# Patient Record
Sex: Female | Born: 1935
Health system: Southern US, Community
[De-identification: ages and names within clinical notes are randomized; demographics above are authoritative.]

## PROBLEM LIST (undated history)

## (undated) DIAGNOSIS — I2699 Other pulmonary embolism without acute cor pulmonale: Secondary | ICD-10-CM

## (undated) DIAGNOSIS — E785 Hyperlipidemia, unspecified: Secondary | ICD-10-CM

## (undated) DIAGNOSIS — N183 Chronic kidney disease, stage 3 unspecified: Secondary | ICD-10-CM

## (undated) DIAGNOSIS — M1990 Unspecified osteoarthritis, unspecified site: Secondary | ICD-10-CM

## (undated) DIAGNOSIS — R011 Cardiac murmur, unspecified: Secondary | ICD-10-CM

## (undated) DIAGNOSIS — I82409 Acute embolism and thrombosis of unspecified deep veins of unspecified lower extremity: Secondary | ICD-10-CM

## (undated) DIAGNOSIS — I482 Chronic atrial fibrillation, unspecified: Secondary | ICD-10-CM

## (undated) DIAGNOSIS — I639 Cerebral infarction, unspecified: Secondary | ICD-10-CM

## (undated) DIAGNOSIS — J189 Pneumonia, unspecified organism: Secondary | ICD-10-CM

## (undated) DIAGNOSIS — I10 Essential (primary) hypertension: Secondary | ICD-10-CM

## (undated) DIAGNOSIS — M810 Age-related osteoporosis without current pathological fracture: Secondary | ICD-10-CM

## (undated) HISTORY — PX: CATARACT EXTRACTION W/ INTRAOCULAR LENS  IMPLANT, BILATERAL: SHX1307

---

## 2000-09-06 DIAGNOSIS — I82409 Acute embolism and thrombosis of unspecified deep veins of unspecified lower extremity: Secondary | ICD-10-CM

## 2000-09-06 HISTORY — DX: Acute embolism and thrombosis of unspecified deep veins of unspecified lower extremity: I82.409

## 2001-09-06 DIAGNOSIS — I2699 Other pulmonary embolism without acute cor pulmonale: Secondary | ICD-10-CM

## 2001-09-06 HISTORY — DX: Other pulmonary embolism without acute cor pulmonale: I26.99

## 2015-09-10 DIAGNOSIS — R52 Pain, unspecified: Secondary | ICD-10-CM | POA: Diagnosis not present

## 2015-09-10 DIAGNOSIS — I82409 Acute embolism and thrombosis of unspecified deep veins of unspecified lower extremity: Secondary | ICD-10-CM | POA: Diagnosis not present

## 2015-09-10 DIAGNOSIS — Z7901 Long term (current) use of anticoagulants: Secondary | ICD-10-CM | POA: Diagnosis not present

## 2015-09-10 DIAGNOSIS — I2699 Other pulmonary embolism without acute cor pulmonale: Secondary | ICD-10-CM | POA: Diagnosis not present

## 2015-10-20 DIAGNOSIS — M810 Age-related osteoporosis without current pathological fracture: Secondary | ICD-10-CM | POA: Diagnosis not present

## 2015-10-20 DIAGNOSIS — Z86711 Personal history of pulmonary embolism: Secondary | ICD-10-CM | POA: Diagnosis not present

## 2015-10-20 DIAGNOSIS — N183 Chronic kidney disease, stage 3 (moderate): Secondary | ICD-10-CM | POA: Diagnosis not present

## 2015-10-20 DIAGNOSIS — E785 Hyperlipidemia, unspecified: Secondary | ICD-10-CM | POA: Diagnosis not present

## 2015-10-20 DIAGNOSIS — I129 Hypertensive chronic kidney disease with stage 1 through stage 4 chronic kidney disease, or unspecified chronic kidney disease: Secondary | ICD-10-CM | POA: Diagnosis not present

## 2015-10-28 DIAGNOSIS — Z7901 Long term (current) use of anticoagulants: Secondary | ICD-10-CM | POA: Diagnosis not present

## 2015-10-28 DIAGNOSIS — Z86711 Personal history of pulmonary embolism: Secondary | ICD-10-CM | POA: Diagnosis not present

## 2015-10-28 DIAGNOSIS — N184 Chronic kidney disease, stage 4 (severe): Secondary | ICD-10-CM | POA: Diagnosis not present

## 2015-11-25 DIAGNOSIS — Z7901 Long term (current) use of anticoagulants: Secondary | ICD-10-CM | POA: Diagnosis not present

## 2015-11-25 DIAGNOSIS — Z86711 Personal history of pulmonary embolism: Secondary | ICD-10-CM | POA: Diagnosis not present

## 2015-12-09 DIAGNOSIS — R05 Cough: Secondary | ICD-10-CM | POA: Diagnosis not present

## 2015-12-09 DIAGNOSIS — J069 Acute upper respiratory infection, unspecified: Secondary | ICD-10-CM | POA: Diagnosis not present

## 2015-12-16 DIAGNOSIS — J189 Pneumonia, unspecified organism: Secondary | ICD-10-CM | POA: Diagnosis not present

## 2015-12-30 DIAGNOSIS — Z7901 Long term (current) use of anticoagulants: Secondary | ICD-10-CM | POA: Diagnosis not present

## 2015-12-30 DIAGNOSIS — Z86711 Personal history of pulmonary embolism: Secondary | ICD-10-CM | POA: Diagnosis not present

## 2015-12-30 DIAGNOSIS — J189 Pneumonia, unspecified organism: Secondary | ICD-10-CM | POA: Diagnosis not present

## 2016-01-08 DIAGNOSIS — J189 Pneumonia, unspecified organism: Secondary | ICD-10-CM | POA: Diagnosis not present

## 2016-01-08 DIAGNOSIS — M25512 Pain in left shoulder: Secondary | ICD-10-CM | POA: Diagnosis not present

## 2016-01-29 DIAGNOSIS — J189 Pneumonia, unspecified organism: Secondary | ICD-10-CM | POA: Diagnosis not present

## 2016-01-29 DIAGNOSIS — Z86711 Personal history of pulmonary embolism: Secondary | ICD-10-CM | POA: Diagnosis not present

## 2016-01-29 DIAGNOSIS — Z7901 Long term (current) use of anticoagulants: Secondary | ICD-10-CM | POA: Diagnosis not present

## 2016-02-24 ENCOUNTER — Other Ambulatory Visit: Payer: Self-pay | Admitting: Internal Medicine

## 2016-02-24 DIAGNOSIS — Z1231 Encounter for screening mammogram for malignant neoplasm of breast: Secondary | ICD-10-CM

## 2016-03-01 DIAGNOSIS — Z7901 Long term (current) use of anticoagulants: Secondary | ICD-10-CM | POA: Diagnosis not present

## 2016-03-01 DIAGNOSIS — Z86711 Personal history of pulmonary embolism: Secondary | ICD-10-CM | POA: Diagnosis not present

## 2016-03-11 ENCOUNTER — Ambulatory Visit
Admission: RE | Admit: 2016-03-11 | Discharge: 2016-03-11 | Disposition: A | Discharge status: Home / Self Care | Payer: Medicare Other | Source: Ambulatory Visit | Attending: Internal Medicine | Admitting: Internal Medicine

## 2016-03-11 DIAGNOSIS — Z1231 Encounter for screening mammogram for malignant neoplasm of breast: Secondary | ICD-10-CM | POA: Diagnosis not present

## 2016-03-29 DIAGNOSIS — Z86711 Personal history of pulmonary embolism: Secondary | ICD-10-CM | POA: Diagnosis not present

## 2016-03-29 DIAGNOSIS — Z7901 Long term (current) use of anticoagulants: Secondary | ICD-10-CM | POA: Diagnosis not present

## 2016-04-27 DIAGNOSIS — Z7901 Long term (current) use of anticoagulants: Secondary | ICD-10-CM | POA: Diagnosis not present

## 2016-04-27 DIAGNOSIS — Z86711 Personal history of pulmonary embolism: Secondary | ICD-10-CM | POA: Diagnosis not present

## 2016-05-11 DIAGNOSIS — Z23 Encounter for immunization: Secondary | ICD-10-CM | POA: Diagnosis not present

## 2016-05-11 DIAGNOSIS — Z0000 Encounter for general adult medical examination without abnormal findings: Secondary | ICD-10-CM | POA: Diagnosis not present

## 2016-05-11 DIAGNOSIS — E785 Hyperlipidemia, unspecified: Secondary | ICD-10-CM | POA: Diagnosis not present

## 2016-05-11 DIAGNOSIS — D7589 Other specified diseases of blood and blood-forming organs: Secondary | ICD-10-CM | POA: Diagnosis not present

## 2016-05-11 DIAGNOSIS — I129 Hypertensive chronic kidney disease with stage 1 through stage 4 chronic kidney disease, or unspecified chronic kidney disease: Secondary | ICD-10-CM | POA: Diagnosis not present

## 2016-05-11 DIAGNOSIS — M810 Age-related osteoporosis without current pathological fracture: Secondary | ICD-10-CM | POA: Diagnosis not present

## 2016-05-17 DIAGNOSIS — N184 Chronic kidney disease, stage 4 (severe): Secondary | ICD-10-CM | POA: Diagnosis not present

## 2016-05-17 DIAGNOSIS — I129 Hypertensive chronic kidney disease with stage 1 through stage 4 chronic kidney disease, or unspecified chronic kidney disease: Secondary | ICD-10-CM | POA: Diagnosis not present

## 2016-05-17 DIAGNOSIS — E785 Hyperlipidemia, unspecified: Secondary | ICD-10-CM | POA: Diagnosis not present

## 2016-05-17 DIAGNOSIS — M810 Age-related osteoporosis without current pathological fracture: Secondary | ICD-10-CM | POA: Diagnosis not present

## 2016-05-17 DIAGNOSIS — Z23 Encounter for immunization: Secondary | ICD-10-CM | POA: Diagnosis not present

## 2016-05-31 DIAGNOSIS — Z7901 Long term (current) use of anticoagulants: Secondary | ICD-10-CM | POA: Diagnosis not present

## 2016-05-31 DIAGNOSIS — Z86711 Personal history of pulmonary embolism: Secondary | ICD-10-CM | POA: Diagnosis not present

## 2016-07-01 DIAGNOSIS — Z7901 Long term (current) use of anticoagulants: Secondary | ICD-10-CM | POA: Diagnosis not present

## 2016-07-01 DIAGNOSIS — Z86711 Personal history of pulmonary embolism: Secondary | ICD-10-CM | POA: Diagnosis not present

## 2016-07-05 DIAGNOSIS — L84 Corns and callosities: Secondary | ICD-10-CM | POA: Diagnosis not present

## 2016-07-05 DIAGNOSIS — I739 Peripheral vascular disease, unspecified: Secondary | ICD-10-CM | POA: Diagnosis not present

## 2016-07-05 DIAGNOSIS — M79672 Pain in left foot: Secondary | ICD-10-CM | POA: Diagnosis not present

## 2016-07-05 DIAGNOSIS — M79671 Pain in right foot: Secondary | ICD-10-CM | POA: Diagnosis not present

## 2016-07-05 DIAGNOSIS — L603 Nail dystrophy: Secondary | ICD-10-CM | POA: Diagnosis not present

## 2016-08-02 DIAGNOSIS — Z7901 Long term (current) use of anticoagulants: Secondary | ICD-10-CM | POA: Diagnosis not present

## 2016-08-02 DIAGNOSIS — Z86711 Personal history of pulmonary embolism: Secondary | ICD-10-CM | POA: Diagnosis not present

## 2016-09-06 DIAGNOSIS — J189 Pneumonia, unspecified organism: Secondary | ICD-10-CM

## 2016-09-06 HISTORY — DX: Pneumonia, unspecified organism: J18.9

## 2016-09-07 DIAGNOSIS — Z86711 Personal history of pulmonary embolism: Secondary | ICD-10-CM | POA: Diagnosis not present

## 2016-09-07 DIAGNOSIS — Z7901 Long term (current) use of anticoagulants: Secondary | ICD-10-CM | POA: Diagnosis not present

## 2016-09-07 DIAGNOSIS — I129 Hypertensive chronic kidney disease with stage 1 through stage 4 chronic kidney disease, or unspecified chronic kidney disease: Secondary | ICD-10-CM | POA: Diagnosis not present

## 2016-10-04 DIAGNOSIS — J069 Acute upper respiratory infection, unspecified: Secondary | ICD-10-CM | POA: Diagnosis not present

## 2016-10-11 DIAGNOSIS — Z7901 Long term (current) use of anticoagulants: Secondary | ICD-10-CM | POA: Diagnosis not present

## 2016-10-11 DIAGNOSIS — Z86711 Personal history of pulmonary embolism: Secondary | ICD-10-CM | POA: Diagnosis not present

## 2016-11-08 DIAGNOSIS — M810 Age-related osteoporosis without current pathological fracture: Secondary | ICD-10-CM | POA: Diagnosis not present

## 2016-11-08 DIAGNOSIS — I129 Hypertensive chronic kidney disease with stage 1 through stage 4 chronic kidney disease, or unspecified chronic kidney disease: Secondary | ICD-10-CM | POA: Diagnosis not present

## 2016-11-08 DIAGNOSIS — E785 Hyperlipidemia, unspecified: Secondary | ICD-10-CM | POA: Diagnosis not present

## 2016-11-11 DIAGNOSIS — Z86711 Personal history of pulmonary embolism: Secondary | ICD-10-CM | POA: Diagnosis not present

## 2016-11-11 DIAGNOSIS — Z7901 Long term (current) use of anticoagulants: Secondary | ICD-10-CM | POA: Diagnosis not present

## 2016-11-17 DIAGNOSIS — E7800 Pure hypercholesterolemia, unspecified: Secondary | ICD-10-CM | POA: Diagnosis not present

## 2016-11-17 DIAGNOSIS — I10 Essential (primary) hypertension: Secondary | ICD-10-CM | POA: Diagnosis not present

## 2016-12-13 DIAGNOSIS — Z7901 Long term (current) use of anticoagulants: Secondary | ICD-10-CM | POA: Diagnosis not present

## 2016-12-13 DIAGNOSIS — Z86711 Personal history of pulmonary embolism: Secondary | ICD-10-CM | POA: Diagnosis not present

## 2017-01-05 ENCOUNTER — Other Ambulatory Visit: Payer: Self-pay | Admitting: Internal Medicine

## 2017-01-05 DIAGNOSIS — Z1231 Encounter for screening mammogram for malignant neoplasm of breast: Secondary | ICD-10-CM

## 2017-01-10 DIAGNOSIS — Z86711 Personal history of pulmonary embolism: Secondary | ICD-10-CM | POA: Diagnosis not present

## 2017-01-10 DIAGNOSIS — Z7901 Long term (current) use of anticoagulants: Secondary | ICD-10-CM | POA: Diagnosis not present

## 2017-01-26 DIAGNOSIS — L603 Nail dystrophy: Secondary | ICD-10-CM | POA: Diagnosis not present

## 2017-01-26 DIAGNOSIS — L84 Corns and callosities: Secondary | ICD-10-CM | POA: Diagnosis not present

## 2017-01-26 DIAGNOSIS — I739 Peripheral vascular disease, unspecified: Secondary | ICD-10-CM | POA: Diagnosis not present

## 2017-02-07 DIAGNOSIS — Z7901 Long term (current) use of anticoagulants: Secondary | ICD-10-CM | POA: Diagnosis not present

## 2017-02-07 DIAGNOSIS — Z86711 Personal history of pulmonary embolism: Secondary | ICD-10-CM | POA: Diagnosis not present

## 2017-03-07 DIAGNOSIS — Z86711 Personal history of pulmonary embolism: Secondary | ICD-10-CM | POA: Diagnosis not present

## 2017-03-07 DIAGNOSIS — Z7901 Long term (current) use of anticoagulants: Secondary | ICD-10-CM | POA: Diagnosis not present

## 2017-03-16 ENCOUNTER — Ambulatory Visit: Payer: Self-pay

## 2017-03-18 ENCOUNTER — Ambulatory Visit
Admission: RE | Admit: 2017-03-18 | Discharge: 2017-03-18 | Disposition: A | Discharge status: Home / Self Care | Payer: Medicare Other | Source: Ambulatory Visit | Attending: Internal Medicine | Admitting: Internal Medicine

## 2017-03-18 DIAGNOSIS — Z1231 Encounter for screening mammogram for malignant neoplasm of breast: Secondary | ICD-10-CM | POA: Diagnosis not present

## 2017-04-11 DIAGNOSIS — Z86711 Personal history of pulmonary embolism: Secondary | ICD-10-CM | POA: Diagnosis not present

## 2017-04-11 DIAGNOSIS — Z7901 Long term (current) use of anticoagulants: Secondary | ICD-10-CM | POA: Diagnosis not present

## 2017-04-20 DIAGNOSIS — I739 Peripheral vascular disease, unspecified: Secondary | ICD-10-CM | POA: Diagnosis not present

## 2017-04-20 DIAGNOSIS — L84 Corns and callosities: Secondary | ICD-10-CM | POA: Diagnosis not present

## 2017-04-20 DIAGNOSIS — L603 Nail dystrophy: Secondary | ICD-10-CM | POA: Diagnosis not present

## 2017-05-10 DIAGNOSIS — Z7901 Long term (current) use of anticoagulants: Secondary | ICD-10-CM | POA: Diagnosis not present

## 2017-05-10 DIAGNOSIS — Z86711 Personal history of pulmonary embolism: Secondary | ICD-10-CM | POA: Diagnosis not present

## 2017-05-17 DIAGNOSIS — E559 Vitamin D deficiency, unspecified: Secondary | ICD-10-CM | POA: Diagnosis not present

## 2017-05-17 DIAGNOSIS — Z0000 Encounter for general adult medical examination without abnormal findings: Secondary | ICD-10-CM | POA: Diagnosis not present

## 2017-05-17 DIAGNOSIS — E7800 Pure hypercholesterolemia, unspecified: Secondary | ICD-10-CM | POA: Diagnosis not present

## 2017-05-17 DIAGNOSIS — N390 Urinary tract infection, site not specified: Secondary | ICD-10-CM | POA: Diagnosis not present

## 2017-05-17 DIAGNOSIS — I10 Essential (primary) hypertension: Secondary | ICD-10-CM | POA: Diagnosis not present

## 2017-05-25 DIAGNOSIS — I10 Essential (primary) hypertension: Secondary | ICD-10-CM | POA: Diagnosis not present

## 2017-05-25 DIAGNOSIS — Z7901 Long term (current) use of anticoagulants: Secondary | ICD-10-CM | POA: Diagnosis not present

## 2017-05-25 DIAGNOSIS — E785 Hyperlipidemia, unspecified: Secondary | ICD-10-CM | POA: Diagnosis not present

## 2017-05-25 DIAGNOSIS — M810 Age-related osteoporosis without current pathological fracture: Secondary | ICD-10-CM | POA: Diagnosis not present

## 2017-05-25 DIAGNOSIS — Z23 Encounter for immunization: Secondary | ICD-10-CM | POA: Diagnosis not present

## 2017-05-25 DIAGNOSIS — Z86711 Personal history of pulmonary embolism: Secondary | ICD-10-CM | POA: Diagnosis not present

## 2017-05-25 DIAGNOSIS — N184 Chronic kidney disease, stage 4 (severe): Secondary | ICD-10-CM | POA: Diagnosis not present

## 2017-06-07 DIAGNOSIS — Z7901 Long term (current) use of anticoagulants: Secondary | ICD-10-CM | POA: Diagnosis not present

## 2017-06-07 DIAGNOSIS — Z86711 Personal history of pulmonary embolism: Secondary | ICD-10-CM | POA: Diagnosis not present

## 2017-07-18 DIAGNOSIS — Z7901 Long term (current) use of anticoagulants: Secondary | ICD-10-CM | POA: Diagnosis not present

## 2017-07-18 DIAGNOSIS — Z86711 Personal history of pulmonary embolism: Secondary | ICD-10-CM | POA: Diagnosis not present

## 2017-08-01 DIAGNOSIS — L84 Corns and callosities: Secondary | ICD-10-CM | POA: Diagnosis not present

## 2017-08-01 DIAGNOSIS — L603 Nail dystrophy: Secondary | ICD-10-CM | POA: Diagnosis not present

## 2017-08-01 DIAGNOSIS — I739 Peripheral vascular disease, unspecified: Secondary | ICD-10-CM | POA: Diagnosis not present

## 2017-08-11 DIAGNOSIS — Z7901 Long term (current) use of anticoagulants: Secondary | ICD-10-CM | POA: Diagnosis not present

## 2017-08-11 DIAGNOSIS — Z86711 Personal history of pulmonary embolism: Secondary | ICD-10-CM | POA: Diagnosis not present

## 2017-09-08 DIAGNOSIS — Z86711 Personal history of pulmonary embolism: Secondary | ICD-10-CM | POA: Diagnosis not present

## 2017-09-08 DIAGNOSIS — Z7901 Long term (current) use of anticoagulants: Secondary | ICD-10-CM | POA: Diagnosis not present

## 2017-09-21 DIAGNOSIS — J111 Influenza due to unidentified influenza virus with other respiratory manifestations: Secondary | ICD-10-CM | POA: Diagnosis not present

## 2017-09-29 DIAGNOSIS — Z7901 Long term (current) use of anticoagulants: Secondary | ICD-10-CM | POA: Diagnosis not present

## 2017-09-29 DIAGNOSIS — Z86711 Personal history of pulmonary embolism: Secondary | ICD-10-CM | POA: Diagnosis not present

## 2017-11-01 DIAGNOSIS — Z7901 Long term (current) use of anticoagulants: Secondary | ICD-10-CM | POA: Diagnosis not present

## 2017-11-01 DIAGNOSIS — Z86711 Personal history of pulmonary embolism: Secondary | ICD-10-CM | POA: Diagnosis not present

## 2017-11-09 DIAGNOSIS — L603 Nail dystrophy: Secondary | ICD-10-CM | POA: Diagnosis not present

## 2017-11-09 DIAGNOSIS — L84 Corns and callosities: Secondary | ICD-10-CM | POA: Diagnosis not present

## 2017-11-09 DIAGNOSIS — I739 Peripheral vascular disease, unspecified: Secondary | ICD-10-CM | POA: Diagnosis not present

## 2017-11-21 DIAGNOSIS — E785 Hyperlipidemia, unspecified: Secondary | ICD-10-CM | POA: Diagnosis not present

## 2017-11-21 DIAGNOSIS — I129 Hypertensive chronic kidney disease with stage 1 through stage 4 chronic kidney disease, or unspecified chronic kidney disease: Secondary | ICD-10-CM | POA: Diagnosis not present

## 2017-11-23 DIAGNOSIS — M25512 Pain in left shoulder: Secondary | ICD-10-CM | POA: Diagnosis not present

## 2017-11-23 DIAGNOSIS — E785 Hyperlipidemia, unspecified: Secondary | ICD-10-CM | POA: Diagnosis not present

## 2017-11-23 DIAGNOSIS — G8929 Other chronic pain: Secondary | ICD-10-CM | POA: Diagnosis not present

## 2017-11-23 DIAGNOSIS — N184 Chronic kidney disease, stage 4 (severe): Secondary | ICD-10-CM | POA: Diagnosis not present

## 2017-11-23 DIAGNOSIS — I10 Essential (primary) hypertension: Secondary | ICD-10-CM | POA: Diagnosis not present

## 2017-11-29 DIAGNOSIS — Z86711 Personal history of pulmonary embolism: Secondary | ICD-10-CM | POA: Diagnosis not present

## 2017-11-29 DIAGNOSIS — Z7901 Long term (current) use of anticoagulants: Secondary | ICD-10-CM | POA: Diagnosis not present

## 2017-12-13 DIAGNOSIS — M19012 Primary osteoarthritis, left shoulder: Secondary | ICD-10-CM | POA: Diagnosis not present

## 2017-12-13 DIAGNOSIS — N189 Chronic kidney disease, unspecified: Secondary | ICD-10-CM | POA: Diagnosis not present

## 2017-12-13 DIAGNOSIS — M7552 Bursitis of left shoulder: Secondary | ICD-10-CM | POA: Diagnosis not present

## 2017-12-13 DIAGNOSIS — M25512 Pain in left shoulder: Secondary | ICD-10-CM | POA: Diagnosis not present

## 2017-12-13 DIAGNOSIS — M7582 Other shoulder lesions, left shoulder: Secondary | ICD-10-CM | POA: Diagnosis not present

## 2018-01-02 DIAGNOSIS — Z7901 Long term (current) use of anticoagulants: Secondary | ICD-10-CM | POA: Diagnosis not present

## 2018-01-02 DIAGNOSIS — Z86711 Personal history of pulmonary embolism: Secondary | ICD-10-CM | POA: Diagnosis not present

## 2018-02-02 DIAGNOSIS — Z7901 Long term (current) use of anticoagulants: Secondary | ICD-10-CM | POA: Diagnosis not present

## 2018-02-02 DIAGNOSIS — Z86711 Personal history of pulmonary embolism: Secondary | ICD-10-CM | POA: Diagnosis not present

## 2018-02-07 ENCOUNTER — Other Ambulatory Visit: Payer: Self-pay | Admitting: Internal Medicine

## 2018-02-07 DIAGNOSIS — Z1231 Encounter for screening mammogram for malignant neoplasm of breast: Secondary | ICD-10-CM

## 2018-03-01 DIAGNOSIS — Z7901 Long term (current) use of anticoagulants: Secondary | ICD-10-CM | POA: Diagnosis not present

## 2018-03-01 DIAGNOSIS — Z86711 Personal history of pulmonary embolism: Secondary | ICD-10-CM | POA: Diagnosis not present

## 2018-03-22 ENCOUNTER — Other Ambulatory Visit: Payer: Self-pay | Admitting: Internal Medicine

## 2018-03-22 ENCOUNTER — Ambulatory Visit
Admission: RE | Admit: 2018-03-22 | Discharge: 2018-03-22 | Disposition: A | Discharge status: Home / Self Care | Payer: Medicare Other | Source: Ambulatory Visit | Attending: Internal Medicine | Admitting: Internal Medicine

## 2018-03-22 DIAGNOSIS — Z1231 Encounter for screening mammogram for malignant neoplasm of breast: Secondary | ICD-10-CM | POA: Diagnosis not present

## 2018-03-30 DIAGNOSIS — Z86711 Personal history of pulmonary embolism: Secondary | ICD-10-CM | POA: Diagnosis not present

## 2018-03-30 DIAGNOSIS — Z7901 Long term (current) use of anticoagulants: Secondary | ICD-10-CM | POA: Diagnosis not present

## 2018-03-31 ENCOUNTER — Emergency Department (HOSPITAL_COMMUNITY): Payer: Medicare Other

## 2018-03-31 ENCOUNTER — Observation Stay (HOSPITAL_COMMUNITY)
Admission: EM | Admit: 2018-03-31 | Discharge: 2018-04-01 | Disposition: A | Discharge status: Home / Self Care | Payer: Medicare Other | Attending: Internal Medicine | Admitting: Internal Medicine

## 2018-03-31 ENCOUNTER — Encounter (HOSPITAL_COMMUNITY): Payer: Self-pay | Admitting: Emergency Medicine

## 2018-03-31 ENCOUNTER — Other Ambulatory Visit: Payer: Self-pay

## 2018-03-31 DIAGNOSIS — G459 Transient cerebral ischemic attack, unspecified: Principal | ICD-10-CM

## 2018-03-31 DIAGNOSIS — Z86711 Personal history of pulmonary embolism: Secondary | ICD-10-CM

## 2018-03-31 DIAGNOSIS — I10 Essential (primary) hypertension: Secondary | ICD-10-CM

## 2018-03-31 DIAGNOSIS — R5383 Other fatigue: Secondary | ICD-10-CM | POA: Diagnosis not present

## 2018-03-31 DIAGNOSIS — I639 Cerebral infarction, unspecified: Secondary | ICD-10-CM

## 2018-03-31 DIAGNOSIS — R4182 Altered mental status, unspecified: Secondary | ICD-10-CM | POA: Diagnosis not present

## 2018-03-31 DIAGNOSIS — Z79899 Other long term (current) drug therapy: Secondary | ICD-10-CM | POA: Diagnosis not present

## 2018-03-31 DIAGNOSIS — R0602 Shortness of breath: Secondary | ICD-10-CM | POA: Diagnosis not present

## 2018-03-31 DIAGNOSIS — E785 Hyperlipidemia, unspecified: Secondary | ICD-10-CM

## 2018-03-31 DIAGNOSIS — N189 Chronic kidney disease, unspecified: Secondary | ICD-10-CM

## 2018-03-31 DIAGNOSIS — R410 Disorientation, unspecified: Secondary | ICD-10-CM | POA: Diagnosis not present

## 2018-03-31 DIAGNOSIS — R4020 Unspecified coma: Secondary | ICD-10-CM | POA: Diagnosis not present

## 2018-03-31 DIAGNOSIS — Z7901 Long term (current) use of anticoagulants: Secondary | ICD-10-CM | POA: Diagnosis not present

## 2018-03-31 HISTORY — DX: Hyperlipidemia, unspecified: E78.5

## 2018-03-31 HISTORY — DX: Pneumonia, unspecified organism: J18.9

## 2018-03-31 HISTORY — DX: Age-related osteoporosis without current pathological fracture: M81.0

## 2018-03-31 HISTORY — DX: Unspecified osteoarthritis, unspecified site: M19.90

## 2018-03-31 HISTORY — DX: Cerebral infarction, unspecified: I63.9

## 2018-03-31 HISTORY — DX: Essential (primary) hypertension: I10

## 2018-03-31 HISTORY — DX: Other pulmonary embolism without acute cor pulmonale: I26.99

## 2018-03-31 HISTORY — DX: Chronic kidney disease, stage 3 unspecified: N18.30

## 2018-03-31 HISTORY — DX: Chronic kidney disease, stage 3 (moderate): N18.3

## 2018-03-31 HISTORY — DX: Chronic atrial fibrillation, unspecified: I48.20

## 2018-03-31 HISTORY — DX: Acute embolism and thrombosis of unspecified deep veins of unspecified lower extremity: I82.409

## 2018-03-31 HISTORY — DX: Cardiac murmur, unspecified: R01.1

## 2018-03-31 LAB — COMPREHENSIVE METABOLIC PANEL
ALBUMIN: 3.8 g/dL (ref 3.5–5.0)
ALT: 19 U/L (ref 0–44)
AST: 26 U/L (ref 15–41)
Alkaline Phosphatase: 42 U/L (ref 38–126)
Anion gap: 9 (ref 5–15)
BUN: 26 mg/dL — AB (ref 8–23)
CHLORIDE: 106 mmol/L (ref 98–111)
CO2: 25 mmol/L (ref 22–32)
CREATININE: 1.99 mg/dL — AB (ref 0.44–1.00)
Calcium: 9.2 mg/dL (ref 8.9–10.3)
GFR calc Af Amer: 24 mL/min — ABNORMAL LOW (ref >60)
GFR calc non Af Amer: 20 mL/min — ABNORMAL LOW (ref >60)
GLUCOSE: 113 mg/dL — AB (ref 70–99)
POTASSIUM: 3.7 mmol/L (ref 3.5–5.1)
Sodium: 140 mmol/L (ref 135–145)
Total Bilirubin: 0.8 mg/dL (ref 0.3–1.2)
Total Protein: 7.4 g/dL (ref 6.5–8.1)

## 2018-03-31 LAB — URINALYSIS, ROUTINE W REFLEX MICROSCOPIC
BILIRUBIN URINE: NEGATIVE
Glucose, UA: NEGATIVE mg/dL
HGB URINE DIPSTICK: NEGATIVE
KETONES UR: NEGATIVE mg/dL
LEUKOCYTES UA: NEGATIVE
NITRITE: NEGATIVE
PROTEIN: 30 mg/dL — AB
Specific Gravity, Urine: 1.012 (ref 1.005–1.030)
pH: 7 (ref 5.0–8.0)

## 2018-03-31 LAB — DIFFERENTIAL
ABS IMMATURE GRANULOCYTES: 0 10*3/uL (ref 0.0–0.1)
BASOS PCT: 1 %
Basophils Absolute: 0 10*3/uL (ref 0.0–0.1)
Eosinophils Absolute: 0.1 10*3/uL (ref 0.0–0.7)
Eosinophils Relative: 2 %
IMMATURE GRANULOCYTES: 0 %
LYMPHS ABS: 1.6 10*3/uL (ref 0.7–4.0)
Lymphocytes Relative: 43 %
Monocytes Absolute: 0.5 10*3/uL (ref 0.1–1.0)
Monocytes Relative: 12 %
NEUTROS ABS: 1.6 10*3/uL — AB (ref 1.7–7.7)
NEUTROS PCT: 42 %

## 2018-03-31 LAB — CBC
HCT: 41.2 % (ref 36.0–46.0)
Hemoglobin: 13.2 g/dL (ref 12.0–15.0)
MCH: 34.6 pg — AB (ref 26.0–34.0)
MCHC: 32 g/dL (ref 30.0–36.0)
MCV: 108.1 fL — ABNORMAL HIGH (ref 78.0–100.0)
Platelets: 146 10*3/uL — ABNORMAL LOW (ref 150–400)
RBC: 3.81 MIL/uL — ABNORMAL LOW (ref 3.87–5.11)
RDW: 12.8 % (ref 11.5–15.5)
WBC: 3.7 10*3/uL — ABNORMAL LOW (ref 4.0–10.5)

## 2018-03-31 LAB — PROTIME-INR
INR: 2.04
Prothrombin Time: 22.9 seconds — ABNORMAL HIGH (ref 11.4–15.2)

## 2018-03-31 LAB — CBG MONITORING, ED: Glucose-Capillary: 106 mg/dL — ABNORMAL HIGH (ref 70–99)

## 2018-03-31 LAB — I-STAT TROPONIN, ED: Troponin i, poc: 0.02 ng/mL (ref 0.00–0.08)

## 2018-03-31 LAB — APTT: APTT: 33 s (ref 24–36)

## 2018-03-31 MED ORDER — ONDANSETRON HCL 4 MG/2ML IJ SOLN: 4.0000 mg | Freq: Four times a day (QID) | INTRAMUSCULAR | Status: DC | PRN

## 2018-03-31 MED ORDER — WARFARIN - PHARMACIST DOSING INPATIENT: Freq: Every day | Status: DC

## 2018-03-31 MED ORDER — HYDRALAZINE HCL 20 MG/ML IJ SOLN: 5.0000 mg | INTRAMUSCULAR | Status: DC | PRN

## 2018-03-31 MED ORDER — DICLOFENAC SODIUM 1 % TD GEL
4.0000 g | Freq: Four times a day (QID) | TRANSDERMAL | Status: DC
  Administered 2018-03-31 – 2018-04-01 (×2): 4 g via TOPICAL
  Filled 2018-03-31: qty 100

## 2018-03-31 MED ORDER — HYDROCODONE-ACETAMINOPHEN 5-325 MG PO TABS: 1.0000 | ORAL_TABLET | ORAL | Status: DC | PRN

## 2018-03-31 MED ORDER — WARFARIN SODIUM 3 MG PO TABS
3.5000 mg | ORAL_TABLET | Freq: Once | ORAL | Status: AC
  Administered 2018-03-31: 3.5 mg via ORAL
  Filled 2018-03-31: qty 1

## 2018-03-31 MED ORDER — BISACODYL 10 MG RE SUPP: 10.0000 mg | Freq: Every day | RECTAL | Status: DC | PRN

## 2018-03-31 MED ORDER — HYDRALAZINE HCL 20 MG/ML IJ SOLN: 5.0000 mg | Freq: Three times a day (TID) | INTRAMUSCULAR | Status: DC | PRN

## 2018-03-31 MED ORDER — SODIUM CHLORIDE 0.9 % IV BOLUS
1000.0000 mL | Freq: Once | INTRAVENOUS | Status: AC
  Administered 2018-03-31: 1000 mL via INTRAVENOUS

## 2018-03-31 MED ORDER — VITAMIN B-12 1000 MCG PO TABS
1000.0000 ug | ORAL_TABLET | Freq: Every day | ORAL | Status: DC
  Administered 2018-04-01: 1000 ug via ORAL

## 2018-03-31 MED ORDER — ACETAMINOPHEN 650 MG RE SUPP: 650.0000 mg | Freq: Four times a day (QID) | RECTAL | Status: DC | PRN

## 2018-03-31 MED ORDER — SENNOSIDES-DOCUSATE SODIUM 8.6-50 MG PO TABS: 1.0000 | ORAL_TABLET | Freq: Every evening | ORAL | Status: DC | PRN

## 2018-03-31 MED ORDER — ACETAMINOPHEN 325 MG PO TABS: 650.0000 mg | ORAL_TABLET | Freq: Four times a day (QID) | ORAL | Status: DC | PRN

## 2018-03-31 MED ORDER — ONDANSETRON HCL 4 MG PO TABS: 4.0000 mg | ORAL_TABLET | Freq: Four times a day (QID) | ORAL | Status: DC | PRN

## 2018-03-31 MED ORDER — STROKE: EARLY STAGES OF RECOVERY BOOK
Freq: Once | Status: AC
  Administered 2018-03-31: 19:00:00
  Filled 2018-03-31: qty 1

## 2018-03-31 MED ORDER — ATORVASTATIN CALCIUM 40 MG PO TABS
40.0000 mg | ORAL_TABLET | Freq: Every day | ORAL | Status: DC
  Administered 2018-03-31 – 2018-04-01 (×2): 40 mg via ORAL
  Filled 2018-03-31: qty 1

## 2018-03-31 MED ORDER — SODIUM CHLORIDE 0.9 % IV SOLN
INTRAVENOUS | Status: DC
  Administered 2018-03-31 – 2018-04-01 (×2): via INTRAVENOUS

## 2018-03-31 NOTE — ED Triage Notes (Signed)
Patient brought in by family, states she normally gets up and dressed independently but this morning found still seated on the side of the bed asking for help at 0900. Last known well last night at 2100. Patient alert, oriented, and in no apparent distress at this time. No facial droop, grip strength equal bilaterally, no vision changes. Patient anticoagulated with coumadin. INR yesterday was 2.1

## 2018-03-31 NOTE — ED Notes (Addendum)
Pt alert and answering questions appropriately.  Pt able to tranfer to [facility] with some help.

## 2018-03-31 NOTE — Progress Notes (Signed)
ANTICOAGULATION CONSULT NOTE - Initial Consult  Pharmacy Consult for Coumadin Indication: atrial fibrillation / Hx of PE  No Known Allergies  Patient Measurements:    Vital Signs: Temp: 97.7 F (36.5 C) (07/26 1313) Temp Source: Oral (07/26 1046) BP: 157/79 (07/26 1500) Pulse Rate: 75 (07/26 1500)  Labs: Recent Labs    03/31/18 1107  HGB 13.2  HCT 41.2  PLT 146*  APTT 33  LABPROT 22.9*  INR 2.04  CREATININE 1.99*    CrCl cannot be calculated (Unknown ideal weight.).   Medical History: Past Medical History:  Diagnosis Date  . Chronic kidney disease   . Heart murmur   . Hyperlipidemia   . Hypertension   . Osteoporosis   . Pulmonary embolism ([place]) 2012    Assessment: 84 yo F presents on 7/26 with AMS. On Coumadin 3.5mg  PO daily PTA for hx of PE. INR 2.04 on admit. Hgb stable and plts 146.  Goal of Therapy:  INR 2-3 Monitor platelets by anticoagulation protocol: Yes   Plan:  Give Coumadin 3.5mg  PO x 1 Monitor daily INR, CBC, s/s of bleed   Skyanne Welle J 03/31/2018,3:54 PM

## 2018-03-31 NOTE — ED Notes (Signed)
Patient transported to MRI 

## 2018-03-31 NOTE — ED Notes (Signed)
Pt given [place] sandwich and apple juice. Family at bedside.

## 2018-03-31 NOTE — H&P (Signed)
History and Physical    Allison Rhodes ZOX:[id number] DOB: [birth year] DOA: 03/31/2018   PCP: Merrilee Seashore, MD   Patient coming from:  Home    Chief Complaint:  Gait instability   HPI: Allison Rhodes is a 84 y.o. female with medical history significant for hypertension, hyperlipidemia, osteoporosis, pulmonary embolism, atrial fibrillation on chronic Coumadin, CKD, fairly independent with her ADLs, brought to the emergency department for evaluation of gait instability.  According to the family report, the patient was in her usual state of health yesterday, but woke up this morning somewhat confused, sitting on the side of the bed, did not know how to put her clothes on, and was unable to walk.  She was leaning to the right side.  Family try to help her to the restroom, and then place her back into the bed.  To check her blood pressure, was 160/100.  (Her normal blood pressures are in the 110s).  There was mild delay in her speech, but there was no slurred or incomprehensive ability.  There was no facial asymmetry.  This lasted "a few minutes ", and then returned to her baseline.  The family denies the patient having any similar episodes, history of a stroke or family history of CVA.  The patient reported feeling "out of it ".  She has mild headaches, but denies any vision changes.  She denies any dysphagia, odynophagia, neck pain, chest pain or palpitations.  She denies any syncope, presyncope, or dizziness.  She denies any shortness of breath or cough, or sick contacts.  She denies any abdominal pain, nausea or vomiting or diarrhea.  She denies any dysuria, gross hematuria.  She admits to not drinking enough fluids.  The patient is compliant with her medications, especially with Coumadin.  She denies any tobacco, alcohol or recreational drug use.  There are no new over-the-counter products, or herbal meds.  No bleeding issues are reported.   ED Course:  BP (!) 157/79   Pulse 75   Temp  97.7 F (36.5 C)   Resp 18   SpO2 92%   Suspecting TIA, or CVA, the patient underwent a CT of the head, which was negative for acute findings.  MRI of the brain is pending.  Dr. Malen Gauze, neurology has been contacted, and is to see the patient in consultation, with further recommendations. PT 22.9, INR 2.04 White count 3.7, platelets 146. Glucose 113 Troponin -0 0.02. Urinalysis negative for nitrites or leukocytes Chest x-ray with bilateral atelectasis, but no acute pulmonary disease. Patient was dehydrated, received 1 L of IV normal saline bolus No other medications received in the ER.,  Now at 75 cc an hour.  Review of Systems:  As per HPI otherwise all other systems reviewed and are negative  Past Medical History:  Diagnosis Date  . Chronic kidney disease   . Heart murmur   . Hyperlipidemia   . Hypertension   . Osteoporosis   . Pulmonary embolism ([place]) 2012    History reviewed. No pertinent surgical history.  Social History Social History   Socioeconomic History  . Marital status: Widowed    Spouse name: Not on file  . Number of children: Not on file  . Years of education: Not on file  . Highest education level: Not on file  Occupational History  . Not on file  Social Needs  . Financial resource strain: Not on file  . Food insecurity:    Worry: Not on file    Inability:  Not on file  . Transportation needs:    Medical: Not on file    Non-medical: Not on file  Tobacco Use  . Smoking status: Former Research scientist (life sciences)  . Smokeless tobacco: Never Used  Substance and Sexual Activity  . Alcohol use: Never    Frequency: Never  . Drug use: Never  . Sexual activity: Not Currently  Lifestyle  . Physical activity:    Days per week: Not on file    Minutes per session: Not on file  . Stress: Not on file  Relationships  . Social connections:    Talks on phone: Not on file    Gets together: Not on file    Attends religious service: Not on file    Active member of club or  organization: Not on file    Attends meetings of clubs or organizations: Not on file    Relationship status: Not on file  . Intimate partner violence:    Fear of current or ex partner: Not on file    Emotionally abused: Not on file    Physically abused: Not on file    Forced sexual activity: Not on file  Other Topics Concern  . Not on file  Social History Narrative  . Not on file     No Known Allergies  Family History  Problem Relation Age of Onset  . Breast cancer Neg Hx        Prior to Admission medications   Medication Sig Start Date End Date Taking? Authorizing Provider  acetaminophen (TYLENOL) 500 MG tablet Take 1,000 mg by mouth as needed for mild pain or headache.   Yes [provider]  atorvastatin (LIPITOR) 20 MG tablet Take 20 mg by mouth daily. 03/13/18  Yes [provider]  diclofenac sodium (VOLTAREN) 1 % GEL Apply 4 g topically 3 (three) times daily as needed for pain. 03/21/18  Yes [provider]  fluticasone (FLONASE) 50 MCG/ACT nasal spray Place 1 spray into both nostrils daily as needed for allergies or rhinitis.   Yes [provider]  furosemide (LASIX) 20 MG tablet Take 20 mg by mouth as needed for fluid.   Yes [provider]  hydrochlorothiazide (HYDRODIURIL) 25 MG tablet Take 25 mg by mouth daily. 02/15/18  Yes [provider]  JANTOVEN 3 MG tablet Take 3.5 mg by mouth daily. 02/14/18  Yes [provider]  loratadine (CLARITIN) 10 MG tablet Take 10 mg by mouth daily as needed for allergies.   Yes [provider]  Multiple Vitamins-Minerals (CENTRUM ADULTS PO) Take 1 tablet by mouth daily.   Yes [provider]  vitamin B-12 (CYANOCOBALAMIN) 1000 MCG tablet Take 1,000 mcg by mouth daily.   Yes [provider]  Vitamin D, Ergocalciferol, 2000 units CAPS Take 1 tablet by mouth daily.   Yes [provider]  vitamin E (VITAMIN E) 1000 UNIT capsule Take 1,000 Units by  mouth daily.   Yes [provider]  warfarin (JANTOVEN) 1 MG tablet Take 0.5 mg by mouth See admin instructions. Taking 1/2 tablet 0.5mg  with the 3mg   (  For 3.5mg  total)   Yes [provider]     Physical Exam:  Vitals:   03/31/18 1345 03/31/18 1400 03/31/18 1415 03/31/18 1500  BP: (!) 158/83 (!) 153/80 (!) 182/97 (!) 157/79  Pulse:   75 75  Resp: 18 15 (!) 22 18  Temp:      TempSrc:      SpO2:  92%   Constitutional: NAD, calm, comfortable, looks much younger than her stated age. Eyes: PERRL, lids and conjunctivae normal ENMT: Mucous membranes are dry, without exudate or lesions  Neck: normal, supple, no masses, no thyromegaly Respiratory: clear to auscultation bilaterally, no wheezing, no crackles. Normal respiratory effort  Cardiovascular: Irregularly irregular rate and rhythm, 1 out of 6 systolic murmur, no rubs or gallops.  Trace of bilateral lower extremity edema. 2+ pedal pulses. No carotid bruits.  Abdomen: Soft, non tender, No hepatosplenomegaly. Bowel sounds positive.  Musculoskeletal: no clubbing / cyanosis. Moves all extremities Skin: no jaundice, No lesions.  Neurologic: Sensation intact  Strength equal in all extremities Psychiatric:   Alert and oriented x 3. Normal mood.     Labs on Admission: I have personally reviewed following labs and imaging studies  CBC: Recent Labs  Lab 03/31/18 1107  WBC 3.7*  NEUTROABS 1.6*  HGB 13.2  HCT 41.2  MCV 108.1*  PLT 146*    Basic Metabolic Panel: Recent Labs  Lab 03/31/18 1107  NA 140  K 3.7  CL 106  CO2 25  GLUCOSE 113*  BUN 26*  CREATININE 1.99*  CALCIUM 9.2    GFR: CrCl cannot be calculated (Unknown ideal weight.).  Liver Function Tests: Recent Labs  Lab 03/31/18 1107  AST 26  ALT 19  ALKPHOS 42  BILITOT 0.8  PROT 7.4  ALBUMIN 3.8   No results for input(s): LIPASE, AMYLASE in the last 168 hours. No results for input(s): AMMONIA in the last 168 hours.  Coagulation  Profile: Recent Labs  Lab 03/31/18 1107  INR 2.04    Cardiac Enzymes: No results for input(s): CKTOTAL, CKMB, CKMBINDEX, TROPONINI in the last 168 hours.  BNP (last 3 results) No results for input(s): PROBNP in the last 8760 hours.  HbA1C: No results for input(s): HGBA1C in the last 72 hours.  CBG: Recent Labs  Lab 03/31/18 1308  GLUCAP 106*    Lipid Profile: No results for input(s): CHOL, HDL, LDLCALC, TRIG, CHOLHDL, LDLDIRECT in the last 72 hours.  Thyroid Function Tests: No results for input(s): TSH, T4TOTAL, FREET4, T3FREE, THYROIDAB in the last 72 hours.  Anemia Panel: No results for input(s): VITAMINB12, FOLATE, FERRITIN, TIBC, IRON, RETICCTPCT in the last 72 hours.  Urine analysis:    Component Value Date/Time   COLORURINE YELLOW 03/31/2018 1215   APPEARANCEUR HAZY (A) 03/31/2018 1215   LABSPEC 1.012 03/31/2018 1215   PHURINE 7.0 03/31/2018 1215   GLUCOSEU NEGATIVE 03/31/2018 1215   HGBUR NEGATIVE 03/31/2018 1215   BILIRUBINUR NEGATIVE 03/31/2018 1215   KETONESUR NEGATIVE 03/31/2018 1215   PROTEINUR 30 (A) 03/31/2018 1215   NITRITE NEGATIVE 03/31/2018 1215   LEUKOCYTESUR NEGATIVE 03/31/2018 1215    Sepsis Labs: @LABRCNTIP (procalcitonin:4,lacticidven:4) )No results found for this or any previous visit (from the past 240 hour(s)).   Radiological Exams on Admission: Dg Chest 2 View  Result Date: 03/31/2018 CLINICAL DATA:  Shortness of breath. EXAM: CHEST - 2 VIEW COMPARISON:  None. FINDINGS: The patient is slightly rotated to the left. Borderline cardiomegaly. Normal pulmonary vascularity. Mild bibasilar atelectasis. No focal consolidation, pleural effusion, or pneumothorax. Increased right paratracheal density is favored to reflect vascular structures given patient's slight rotation. No acute osseous abnormality. Degenerative changes of both shoulders. IMPRESSION: Bibasilar atelectasis.  No active cardiopulmonary disease. Electronically Signed   By: Titus Dubin M.D.   On: 03/31/2018 12:50   Ct Head Wo Contrast  Result Date: 03/31/2018 CLINICAL DATA:  Altered level of consciousness  EXAM: CT HEAD WITHOUT CONTRAST TECHNIQUE: Contiguous axial images were obtained from the base of the skull through the vertex without intravenous contrast. COMPARISON:  None. FINDINGS: Brain: Mild atrophic changes and chronic white matter ischemic changes are seen. No findings to suggest acute hemorrhage, acute infarction or space-occupying mass lesion are seen. Vascular: No hyperdense vessel or unexpected calcification. Skull: Normal. Negative for fracture or focal lesion. Sinuses/Orbits: No acute finding. Other: None. IMPRESSION: Mild atrophic changes and chronic white matter ischemic changes. No acute intracranial abnormality is noted. Electronically Signed   By: Inez Catalina M.D.   On: 03/31/2018 12:35    EKG: Independently reviewed.  Atrial fibrillation T wave abnormality, consider inferolateral ischemia Abnormal ECG No old tracing to compare  Assessment/Plan Active Problems:   TIA (transient ischemic attack)   Hypertension   Hyperlipemia   History of pulmonary embolism   CKD (chronic kidney disease)    Gait instability, temporary confusion  Concerning for TIA. Patient presented with mild confusion and leaning to the R side, quickly resolved,  CT head unremarkable. UA neg. WBC normal.   MRI of the brain is pending. Of note, dehydration was noted, receiving IVF 1 L, now at 25 cc/h  Dr. Malen Gauze, neurology has been contacted, and is to see the patient in consultation, with further recommendations. No prior h/o stroke. RF: HTN, HLD, CKD, age prior h/o PE on chronic coumadin  Telemetry observation  Carotid dopplers, due to CKD and advanced age  2D echo PT/OT Increase Lipitor to 40 mg daily or as indicated by Neuro  Check lipid panel in am  Will hold A1C due to advanced age  Permissive hypertension , hydralazine for BP  Grater than 210/120  Tylenol for  headaches   Hypertension BP (!) 157/79   Pulse 75   Continue home anti-hypertensive medications after 24 h to allow permissive HTN  Add Hydralazine Q6 hours as needed for BP 160/90   Chronic kidney disease stage 4  Current Cr is 1.99. Patient still making urine  Lab Results  Component Value Date   CREATININE 1.99 (H) 03/31/2018   Gentle IVF  Hold diuretics   Repeat BMET in am  Hold NSAIDS     Hyperlipidemia Continue home statins, increase Lipitor to 40 mg daily    History of PE, on Chronic Warfarin. CXR NAD . PT/IRN therapeutic, compliant with meds  Continue Coumadin per pharmacy    DVT prophylaxis:  Coumadin per pharmacy dose  Code Status:    Full code  Family Communication:  Discussed with patient Disposition Plan: Expect patient to be discharged to home after condition improves Consults called:    Neuro, Dr. Rory Percy per EDP  Admission status: Tele OBs    Sharene Butters, PA-C [facility]   Amion text  [id number]   03/31/2018, 3:33 PM

## 2018-03-31 NOTE — Consult Note (Signed)
NEURO HOSPITALIST      Requesting Physician: Dr. Evangeline Gula    Chief Complaint: AMS, gait instability  History obtained from:  Patient / family  HPI:                                                                                                                                         Allison Rhodes is an 84 y.o. female PMH significant for HTN, HLD,  DVT, 2002. PE 2003, afib on Coumadin presents to [facility] after being found confused, with  speech delay.    Per patient when she woke up this morning she "did not feel right". She felt a little dizzy. She was last seen normal 2100 7/25 when she went to bed. Daughter states that usually when she goes into her mother's room in the morning she is awake, alert, dressed and full of energy. This morning her mother had not yet gotten dressed, did not really talk, and when she did talk she did was not herself. She was slow to respond and seemed disconnected. They took her BP which was 160/100 and that is high for patient. Patient normally runs in the 110s. They tried to walk her to the bathroom, but patient was weak. She as found leaning to the right side. Denies slurred speech, facial droop,CP, SOB. Usually patient requires minimal assistance with ADL's, she lives with her daughter and son-in-law. She is able to take and sort  Her medication herself. Uses a cane to walk at baseline.  She states that she had a mild HA but denies blurred vision, syncope or palpitations. Denies taking daily ASA, but is compliant with medications. Symptoms had resolved by the time of examination. No previous stroke history noted.    ED course: BG 113, Bun 26, creatinine 1.99, GFR 24. INR 22.9 ( on coumadin) Head CT: no acute intracranial abnormality MRI brain:  Possible tiny acute lacunar infarct in the dorsal left pons (series 3, image 19), although this could be image noise/artifact.No other acute intracranial abnormality. Chronic  small vessel disease, particularly chronic micro-hemorrhages in the deep gray matter nuclei    No past CVA history per patient and family.   Date last known well: Date: 03/30/2018 Time last known well: Time: 21:00 tPA Given: No: contraindicated on Coumadin Modified Rankin: Rankin Score=3  NIHSS:0  1a Level of Conscious:0 1b LOC Questions: 0 1c LOC Commands: 0 2 Best Gaze: 0 3 Visual: 0 4 Facial Palsy: 0 5a Motor Arm - left: 0 5b Motor Arm - Right: 0 6a Motor Leg - Left0:  6b Motor Leg - Right: 0 7 Limb Ataxia: 0 8  Sensory: 0 9 Best Language:0  10 Dysarthria:0 11 Extinct. and Inattention:0 TOTAL: 0    Past Medical History:  Diagnosis Date  . Chronic kidney disease   . Heart murmur   . Hyperlipidemia   . Hypertension   . Osteoporosis   . Pulmonary embolism ([place]) 2012    History reviewed. No pertinent surgical history.  Family History  Problem Relation Age of Onset  . Breast cancer Neg Hx          Social History:  reports that she has quit smoking. She has never used smokeless tobacco. She reports that she does not drink alcohol or use drugs.  Allergies: No Known Allergies  Medications:                                                                                                                           Scheduled: .  stroke: mapping our early stages of recovery book   Does not apply Once  . atorvastatin  40 mg Oral Daily  . diclofenac sodium  4 g Topical QID  . [START ON 04/01/2018] vitamin B-12  1,000 mcg Oral Daily  . warfarin  3.5 mg Oral Once  . Warfarin - Pharmacist Dosing Inpatient   Does not apply q1800   Continuous: . sodium chloride     GNF:AOZHYQMVHQION **OR** acetaminophen, bisacodyl, hydrALAZINE, HYDROcodone-acetaminophen, ondansetron **OR** ondansetron (ZOFRAN) IV, senna-docusate   ROS:                                                                                                                                       History obtained  from the patient and daughter  General ROS: negative for - chills, fatigue, fever, night sweats, weight gain or weight loss Respiratory ROS: negative for - cough,  shortness of breath or wheezing Cardiovascular ROS: negative for - chest pain, dyspnea on exertion,  Musculoskeletal ROS: negative for - joint swelling or muscular weakness Neurological ROS: as noted in HPI   General Examination:  Blood pressure (!) 150/68, pulse (!) 55, temperature 97.7 F (36.5 C), resp. rate 20, SpO2 99 %.  HEENT-  Normocephalic, no lesions, without obvious abnormality.  Normal external eye and conjunctiva. Cardiovascular-  pulses palpable throughout   Lungs- no excessive working breathing.  Saturations within normal limits on RA Extremities- Warm, dry and intact Musculoskeletal-no joint tenderness, deformity  Skin-warm and dry,intact  Neurological Examination Mental Status: Alert, oriented to person/age/month/place/situation. Stated 2018 as the year.  Speech fluent without evidence of aphasia.  Able to follow  commands without difficulty. Patient is hard of hearing  Cranial Nerves: QI:HKVQQV fields grossly normal,  III,IV, VI: ptosis not present, extra-ocular motions intact bilaterally, pupils equal, round, reactive to light and accommodation V,VII: smile symmetric, facial light touch sensation normal bilaterally VIII: hard of hearing; wears hearing aids at baseline IX,X: uvula rises symmetrically XI: bilateral shoulder shrug XII: midline tongue extension Motor: Right : Upper extremity   5/5    Left:     Upper extremity   5/5  Lower extremity   5/5     Lower extremity   5/5 Tone and bulk:normal tone throughout; no atrophy noted Sensory:  light touch intact throughout, bilaterally Deep Tendon Reflexes: 2+ and symmetric throughout Plantars: Right: downgoing   Left: downgoing Cerebellar: slow  finger-to-nose, slow but normal heel-to-shin test Gait: deferred   Lab Results: Basic Metabolic Panel: Recent Labs  Lab 03/31/18 1107  NA 140  K 3.7  CL 106  CO2 25  GLUCOSE 113*  BUN 26*  CREATININE 1.99*  CALCIUM 9.2    CBC: Recent Labs  Lab 03/31/18 1107  WBC 3.7*  NEUTROABS 1.6*  HGB 13.2  HCT 41.2  MCV 108.1*  PLT 146*    Lipid Panel: No results for input(s): CHOL, TRIG, HDL, CHOLHDL, VLDL, LDLCALC in the last 168 hours.  CBG: Recent Labs  Lab 03/31/18 1308  GLUCAP 106*    Imaging: Dg Chest 2 View  Result Date: 03/31/2018 CLINICAL DATA:  Shortness of breath. EXAM: CHEST - 2 VIEW COMPARISON:  None. FINDINGS: The patient is slightly rotated to the left. Borderline cardiomegaly. Normal pulmonary vascularity. Mild bibasilar atelectasis. No focal consolidation, pleural effusion, or pneumothorax. Increased right paratracheal density is favored to reflect vascular structures given patient's slight rotation. No acute osseous abnormality. Degenerative changes of both shoulders. IMPRESSION: Bibasilar atelectasis.  No active cardiopulmonary disease. Electronically Signed   By: Titus Dubin M.D.   On: 03/31/2018 12:50   Ct Head Wo Contrast  Result Date: 03/31/2018 CLINICAL DATA:  Altered level of consciousness EXAM: CT HEAD WITHOUT CONTRAST TECHNIQUE: Contiguous axial images were obtained from the base of the skull through the vertex without intravenous contrast. COMPARISON:  None. FINDINGS: Brain: Mild atrophic changes and chronic white matter ischemic changes are seen. No findings to suggest acute hemorrhage, acute infarction or space-occupying mass lesion are seen. Vascular: No hyperdense vessel or unexpected calcification. Skull: Normal. Negative for fracture or focal lesion. Sinuses/Orbits: No acute finding. Other: None. IMPRESSION: Mild atrophic changes and chronic white matter ischemic changes. No acute intracranial abnormality is noted. Electronically Signed   By:  Inez Catalina M.D.   On: 03/31/2018 12:35   Mr Brain Wo Contrast  Result Date: 03/31/2018 CLINICAL DATA:  84 year old female with altered mental status. Woke up confused. Leaning to the right side. History of intermittent atrial fibrillation on Coumadin. EXAM: MRI HEAD WITHOUT CONTRAST TECHNIQUE: Multiplanar, multiecho pulse sequences of the brain and surrounding structures were obtained without intravenous contrast. COMPARISON:  Head CT without contrast 1228 hours today. FINDINGS: Brain: Questionable punctate area of restricted diffusion in the dorsal left pons (series 3, image 19), although no associated T2 or FLAIR hyperintensity. Chronic micro hemorrhages scattered throughout the deep gray matter nuclei, most pronounced in the right thalamus. Occasional hemispheric microhemorrhage. Occasional tiny chronic lacune in the left cerebellum. Patchy and confluent bilateral cerebral white matter T2 and FLAIR hyperintensity. No cortical encephalomalacia. No other restricted diffusion. No midline shift, mass effect, evidence of mass lesion, ventriculomegaly, extra-axial collection or acute intracranial hemorrhage. Cervicomedullary junction and pituitary are within normal limits. Vascular: Major intracranial vascular flow voids are preserved. Skull and upper cervical spine: Negative visible cervical spine. Normal bone marrow signal. Sinuses/Orbits: Postoperative changes to both globes. Paranasal sinuses are clear. Other: Mastoids are clear. Visible internal auditory structures appear normal. Scalp and face soft tissues appear negative. IMPRESSION: 1. Possible tiny acute lacunar infarct in the dorsal left pons (series 3, image 19), although this could be image noise/artifact. 2. No other acute intracranial abnormality. 3. Chronic small vessel disease, particularly chronic micro-hemorrhages in the deep gray matter nuclei. Electronically Signed   By: Genevie Ann M.D.   On: 03/31/2018 15:50       Allison Morale, MSN,  NP-C [facility] [phone]  03/31/2018, 5:20 PM    NEUROHOSPITALIST ADDENDUM Seen and examined the patient today. I have reviewed the contents of history and physical exam as documented by PA/ARNP/Resident and agree with above documentation.  I have discussed and formulated the above plan as documented. Edits to the note have been made as needed.  N.   Assessment: 84 y.o. female  PMH significant for HTN, HLD,  DVT, 2002. PE 2003, afib on Coumadin presents to [facility] after being found confused, with  speech delay. Ct head obtained that showed no hemorrhage. MRI brain showed a small left pons infarct.      Acute Ischemic Stroke  Hypertensive Emergency   Stroke Risk Factors - hyperlipidemia, hypertension and age Etiology: small vessel vs cardioembolic  Recommend # MRI of the brain without contrast #MRA Head and neck  #Transthoracic Echo  # continue Warfarin  #Start or continue Atorvastatin 40 mg/other high intensity statin # BP goal: upto 841/324 systolic  # HBAIC and Lipid profile # Telemetry monitoring # Frequent neuro checks #  stroke swallow screen  Please page stroke NP  Or  PA  Or MD from 8am -4 pm  as this patient from this time will be  followed by the stroke.    Karena Addison Advit Trethewey MD [facility] [phone]   If 7pm to 7am, please call on call as listed on AMIO

## 2018-03-31 NOTE — ED Notes (Signed)
Got patient undress on the monitor patient is resting with family at bedside and call bell in reach

## 2018-03-31 NOTE — ED Provider Notes (Signed)
[facility] EMERGENCY DEPARTMENT Provider Note   CSN: [id number] Arrival date & time: 03/31/18  1040     History   Chief Complaint Chief Complaint  Patient presents with  . Altered Mental Status    HPI Allison Rhodes is a 84 y.o. female.  HPI 84 year old female with reported history of pulmonary embolism and intermittent atrial fibrillation on Coumadin here with altered mental status.  Patient was in her usual state of health yesterday.  According to the family report, the patient is usually awake, alert, and able to change her self and perform most ADLs independently.  When she woke up this morning, she was confused, sitting on the side of the bed.  She did not know how to put her clothes on.  She was also unable to walk and was reportedly leaning to her right side.  Family tried to help her to the restroom, then per back on the bed.  They checked her blood pressure and it was 160/100.  Her normal blood pressures in the 110s.  She seemed to be speaking normally, though slightly delayed.  No slurred speech.  No facial asymmetry.  She has now returned back to her baseline.  Family denies any history of similar episodes.  Patient states she just felt "out of it" but denies any overt pain.  However, per family, she did complain of diffuse pain at the time.  Denies any chest pain, shortness of breath, abdominal pain, nausea, vomiting.  No specific alleviating or aggravating factors and symptoms resolve spontaneously over time.  She is been compliant with her Coumadin.  Past Medical History:  Diagnosis Date  . Hyperlipidemia   . Hypertension   . Osteoporosis   . Pulmonary embolism ([place]) 2012    There are no active problems to display for this patient.   History reviewed. No pertinent surgical history.   OB History   None      Home Medications    Prior to Admission medications   Medication Sig Start Date End Date Taking? Authorizing Provider  acetaminophen  (TYLENOL) 500 MG tablet Take 1,000 mg by mouth as needed for mild pain or headache.   Yes [provider]  atorvastatin (LIPITOR) 20 MG tablet Take 20 mg by mouth daily. 03/13/18  Yes [provider]  diclofenac sodium (VOLTAREN) 1 % GEL Apply 4 g topically 3 (three) times daily as needed for pain. 03/21/18  Yes [provider]  fluticasone (FLONASE) 50 MCG/ACT nasal spray Place 1 spray into both nostrils daily as needed for allergies or rhinitis.   Yes [provider]  furosemide (LASIX) 20 MG tablet Take 20 mg by mouth as needed for fluid.   Yes [provider]  hydrochlorothiazide (HYDRODIURIL) 25 MG tablet Take 25 mg by mouth daily. 02/15/18  Yes [provider]  JANTOVEN 3 MG tablet Take 3.5 mg by mouth daily. 02/14/18  Yes [provider]  loratadine (CLARITIN) 10 MG tablet Take 10 mg by mouth daily as needed for allergies.   Yes [provider]  Multiple Vitamins-Minerals (CENTRUM ADULTS PO) Take 1 tablet by mouth daily.   Yes [provider]  vitamin B-12 (CYANOCOBALAMIN) 1000 MCG tablet Take 1,000 mcg by mouth daily.   Yes [provider]  Vitamin D, Ergocalciferol, 2000 units CAPS Take 1 tablet by mouth daily.   Yes [provider]  vitamin E (VITAMIN E) 1000 UNIT capsule Take 1,000 Units by mouth daily.   Yes [provider]  warfarin (JANTOVEN) 1 MG tablet Take 0.5 mg by mouth See admin instructions. Taking 1/2 tablet 0.5mg  with the 3mg   (  For 3.5mg  total)   Yes [provider]    Family History Family History  Problem Relation Age of Onset  . Breast cancer Neg Hx     Social History Social History   Tobacco Use  . Smoking status: Not on file  Substance Use Topics  . Alcohol use: Not on file  . Drug use: Not on file     Allergies   Patient has no known allergies.   Review of Systems Review of Systems  Constitutional: Positive for fatigue. Negative for  chills and fever.  HENT: Negative for congestion and rhinorrhea.   Eyes: Negative for visual disturbance.  Respiratory: Negative for cough, shortness of breath and wheezing.   Cardiovascular: Negative for chest pain and leg swelling.  Gastrointestinal: Negative for abdominal pain, diarrhea, nausea and vomiting.  Genitourinary: Negative for dysuria and flank pain.  Musculoskeletal: Positive for gait problem. Negative for neck pain and neck stiffness.  Skin: Negative for rash and wound.  Allergic/Immunologic: Negative for immunocompromised state.  Neurological: Negative for syncope, weakness and headaches.  Psychiatric/Behavioral: Positive for confusion.  All other systems reviewed and are negative.    Physical Exam Updated Vital Signs BP (!) 157/81   Pulse 85   Temp 97.7 F (36.5 C)   Resp 20   SpO2 100%   Physical Exam  Constitutional: She is oriented to person, place, and time. She appears well-developed and well-nourished. No distress.  HENT:  Head: Normocephalic and atraumatic.  Eyes: Conjunctivae are normal.  Neck: Neck supple.  Cardiovascular: Normal rate, regular rhythm and normal heart sounds. Exam reveals no friction rub.  No murmur heard. Pulmonary/Chest: Effort normal and breath sounds normal. No respiratory distress. She has no wheezes. She has no rales.  Abdominal: She exhibits no distension.  Musculoskeletal: She exhibits no edema.  Neurological: She is alert and oriented to person, place, and time. She exhibits normal muscle tone.  Skin: Skin is warm. Capillary refill takes less than 2 seconds.  Psychiatric: She has a normal mood and affect.  Nursing note and vitals reviewed.   Neurological Exam:  Mental Status: Alert and oriented to person, place, and time. Attention and concentration normal. Speech clear. Recent memory is intact. Cranial Nerves: Visual fields grossly intact. EOMI and PERRLA. No nystagmus noted. Facial sensation intact at forehead, maxillary  cheek, and chin/mandible bilaterally. No facial asymmetry or weakness. Hearing grossly normal. Uvula is midline, and palate elevates symmetrically. Normal SCM and trapezius strength. Tongue midline without fasciculations. Motor: Muscle strength 5/5 in proximal and distal UE and LE bilaterally. No pronator drift. Muscle tone normal. Reflexes: 2+ and symmetrical in all four extremities.  Sensation: Intact to light touch in upper and lower extremities distally bilaterally.  Gait: Normal without ataxia. Coordination: Normal FTN bilaterally.     ED Treatments / Results  Labs (all labs ordered are listed, but only abnormal results are displayed) Labs Reviewed  PROTIME-INR - Abnormal; Notable for the following components:      Result Value   Prothrombin Time 22.9 (*)    All other components within normal limits  CBC - Abnormal; Notable for the following components:   WBC 3.7 (*)    RBC 3.81 (*)    MCV 108.1 (*)    MCH 34.6 (*)    Platelets 146 (*)    All other components within  normal limits  DIFFERENTIAL - Abnormal; Notable for the following components:   Neutro Abs 1.6 (*)    All other components within normal limits  COMPREHENSIVE METABOLIC PANEL - Abnormal; Notable for the following components:   Glucose, Bld 113 (*)    BUN 26 (*)    Creatinine, Ser 1.99 (*)    GFR calc non Af Amer 20 (*)    GFR calc Af Amer 24 (*)    All other components within normal limits  URINALYSIS, ROUTINE W REFLEX MICROSCOPIC - Abnormal; Notable for the following components:   APPearance HAZY (*)    Protein, ur 30 (*)    Bacteria, UA RARE (*)    All other components within normal limits  CBG MONITORING, ED - Abnormal; Notable for the following components:   Glucose-Capillary 106 (*)    All other components within normal limits  APTT  I-STAT TROPONIN, ED    EKG EKG Interpretation  Date/Time:  Friday March 31 2018 11:05:20 EDT Ventricular Rate:  75 PR Interval:    QRS Duration: 86 QT  Interval:  398 QTC Calculation: 444 R Axis:   5 Text Interpretation:  Atrial fibrillation T wave abnormality, consider inferolateral ischemia Abnormal ECG No old tracing to compare Significant baseline artifact Confirmed by Duffy Bruce [phone]) on 03/31/2018 12:46:38 PM   Radiology Dg Chest 2 View  Result Date: 03/31/2018 CLINICAL DATA:  Shortness of breath. EXAM: CHEST - 2 VIEW COMPARISON:  None. FINDINGS: The patient is slightly rotated to the left. Borderline cardiomegaly. Normal pulmonary vascularity. Mild bibasilar atelectasis. No focal consolidation, pleural effusion, or pneumothorax. Increased right paratracheal density is favored to reflect vascular structures given patient's slight rotation. No acute osseous abnormality. Degenerative changes of both shoulders. IMPRESSION: Bibasilar atelectasis.  No active cardiopulmonary disease. Electronically Signed   By: Titus Dubin M.D.   On: 03/31/2018 12:50   Ct Head Wo Contrast  Result Date: 03/31/2018 CLINICAL DATA:  Altered level of consciousness EXAM: CT HEAD WITHOUT CONTRAST TECHNIQUE: Contiguous axial images were obtained from the base of the skull through the vertex without intravenous contrast. COMPARISON:  None. FINDINGS: Brain: Mild atrophic changes and chronic white matter ischemic changes are seen. No findings to suggest acute hemorrhage, acute infarction or space-occupying mass lesion are seen. Vascular: No hyperdense vessel or unexpected calcification. Skull: Normal. Negative for fracture or focal lesion. Sinuses/Orbits: No acute finding. Other: None. IMPRESSION: Mild atrophic changes and chronic white matter ischemic changes. No acute intracranial abnormality is noted. Electronically Signed   By: Inez Catalina M.D.   On: 03/31/2018 12:35    Procedures Procedures (including critical care time)  Medications Ordered in ED Medications  sodium chloride 0.9 % bolus 1,000 mL (1,000 mLs Intravenous New Bag/Given 03/31/18 1314)      Initial Impression / Assessment and Plan / ED Course  I have reviewed the triage vital signs and the nursing notes.  Pertinent labs & imaging results that were available during my care of the patient were reviewed by me and considered in my medical decision making (see chart for details).  Clinical Course as of Mar 31 1436  Fri Jul 26, 576  [id number] 84 year old female here with transient altered mental status.  Differential includes TIA, CVA, possible metabolic encephalopathy.  Lab work shows possible mild acute on chronic kidney injury, which could be accounting for some of her weakness when she sat up, that would not necessarily explain the reported leaning towards one side.  Urinalysis without UTI.  CT had  negative.  Will plan for MRI, discussion with family regarding admission versus outpatient referral for TIA work-up.   [CI]  1410 On further discussion with the family, they would prefer admission for TIA evaluation.  Discussed with Dr. Malen Gauze of neurology, who will see the patient.  Plan to admit to medicine.   [CI]    Clinical Course User Index [CI] Duffy Bruce, MD     Final Clinical Impressions(s) / ED Diagnoses   Final diagnoses:  TIA (transient ischemic attack)    ED Discharge Orders    None       Duffy Bruce, MD 03/31/18 1437

## 2018-03-31 NOTE — ED Notes (Signed)
Patient transported to CT 

## 2018-04-01 ENCOUNTER — Observation Stay (HOSPITAL_BASED_OUTPATIENT_CLINIC_OR_DEPARTMENT_OTHER): Payer: Medicare Other

## 2018-04-01 DIAGNOSIS — I361 Nonrheumatic tricuspid (valve) insufficiency: Secondary | ICD-10-CM

## 2018-04-01 DIAGNOSIS — I10 Essential (primary) hypertension: Secondary | ICD-10-CM

## 2018-04-01 DIAGNOSIS — N183 Chronic kidney disease, stage 3 (moderate): Secondary | ICD-10-CM

## 2018-04-01 DIAGNOSIS — I340 Nonrheumatic mitral (valve) insufficiency: Secondary | ICD-10-CM | POA: Diagnosis not present

## 2018-04-01 DIAGNOSIS — G459 Transient cerebral ischemic attack, unspecified: Secondary | ICD-10-CM | POA: Diagnosis not present

## 2018-04-01 DIAGNOSIS — Z86711 Personal history of pulmonary embolism: Secondary | ICD-10-CM

## 2018-04-01 LAB — ECHOCARDIOGRAM COMPLETE
HEIGHTINCHES: 64 in
WEIGHTICAEL: 2543.23 [oz_av]

## 2018-04-01 LAB — BASIC METABOLIC PANEL
Anion gap: 11 (ref 5–15)
BUN: 25 mg/dL — AB (ref 8–23)
CHLORIDE: 107 mmol/L (ref 98–111)
CO2: 24 mmol/L (ref 22–32)
Calcium: 8.9 mg/dL (ref 8.9–10.3)
Creatinine, Ser: 1.8 mg/dL — ABNORMAL HIGH (ref 0.44–1.00)
GFR calc Af Amer: 27 mL/min — ABNORMAL LOW (ref >60)
GFR calc non Af Amer: 23 mL/min — ABNORMAL LOW (ref >60)
GLUCOSE: 90 mg/dL (ref 70–99)
POTASSIUM: 3.9 mmol/L (ref 3.5–5.1)
Sodium: 142 mmol/L (ref 135–145)

## 2018-04-01 LAB — CBC
HEMATOCRIT: 36.6 % (ref 36.0–46.0)
Hemoglobin: 12.1 g/dL (ref 12.0–15.0)
MCH: 34.1 pg — AB (ref 26.0–34.0)
MCHC: 33.1 g/dL (ref 30.0–36.0)
MCV: 103.1 fL — ABNORMAL HIGH (ref 78.0–100.0)
Platelets: 132 10*3/uL — ABNORMAL LOW (ref 150–400)
RBC: 3.55 MIL/uL — ABNORMAL LOW (ref 3.87–5.11)
RDW: 12.7 % (ref 11.5–15.5)
WBC: 5.2 10*3/uL (ref 4.0–10.5)

## 2018-04-01 LAB — LIPID PANEL
Cholesterol: 132 mg/dL (ref 0–200)
HDL: 32 mg/dL — AB (ref >40)
LDL Cholesterol: 74 mg/dL (ref 0–99)
Total CHOL/HDL Ratio: 4.1 RATIO
Triglycerides: 130 mg/dL (ref <150)
VLDL: 26 mg/dL (ref 0–40)

## 2018-04-01 LAB — HEMOGLOBIN A1C
HEMOGLOBIN A1C: 6 % — AB (ref 4.8–5.6)
MEAN PLASMA GLUCOSE: 125.5 mg/dL

## 2018-04-01 LAB — PROTIME-INR
INR: 2.08
Prothrombin Time: 23.2 seconds — ABNORMAL HIGH (ref 11.4–15.2)

## 2018-04-01 MED ORDER — ATORVASTATIN CALCIUM 40 MG PO TABS: 40.0000 mg | ORAL_TABLET | Freq: Every day | ORAL | 0 refills | Status: AC

## 2018-04-01 MED ORDER — WARFARIN SODIUM 3 MG PO TABS
3.5000 mg | ORAL_TABLET | Freq: Once | ORAL | Status: AC
  Administered 2018-04-01: 3.5 mg via ORAL
  Filled 2018-04-01 (×2): qty 1

## 2018-04-01 NOTE — Care Management Note (Signed)
Case Management Note  Patient Details  Name: Allison Rhodes MRN: [id number] Date of Birth: November 26, 1921  Subjective/Objective:     Pt to be d/c home with family assistance.  Family [facility] for [facility] PT provider and agrees to receive 3n1 as recommended by PT.               Action/Plan: Referral called to [facility] with [facility].  3n1 to be delivered to room prior to d/c.   Expected Discharge Date:  04/01/18               Expected Discharge Plan:  [name]  In-House Referral:  NA  Discharge planning Services  CM Consult  Post Acute Care Choice:  Durable Medical Equipment, Home Health Choice offered to:  Adult Children  DME Arranged:  3-N-1 DME Agency:  [place] Arranged:  PT [place] Agency:  [place]  Status of Service:  Completed, signed off  If discussed at [place] of Stay Meetings, dates discussed:    Additional Comments:  Claudie Leach, RN 04/01/2018, 4:47 PM

## 2018-04-01 NOTE — Evaluation (Signed)
Occupational Therapy Evaluation Patient Details Name: Allison Rhodes MRN: [id number] DOB: 18-Nov-1921 Today's Date: 04/01/2018    History of Present Illness Pt is a 84 y/o female with PMH significant for HTN, HLD,  DVT, 2002. PE 2003, afib on Coumadin presents to [facility] after being found confused, with speech delay and difficulty walking. MRI brain revealed a small left pons infarct.   Clinical Impression   This 84 yo female admitted with above presents to acute OT with decreased balance and apparent left vision deficits with testing compared to right (son-in-law says this is not new). Pt has almost 24 hour S with prn A at home and son-in-law in room says he is ambulating at her baseline (slowly with [facility] and stooped over). No further OT needs identified, we will sign off.    Follow Up Recommendations  No OT follow up;Supervision/Assistance - 24 hour    Equipment Recommendations  None recommended by OT       Precautions / Restrictions Precautions Precautions: Fall Restrictions Weight Bearing Restrictions: No      Mobility Bed Mobility     General bed mobility comments: Pt up in recliner upon my arrival  Transfers Overall transfer level: Needs assistance Equipment used: Straight cane Transfers: Sit to/from Stand Sit to Stand: Min guard            Balance Overall balance assessment: Needs assistance Sitting-balance support: Feet supported Sitting balance-Leahy Scale: Fair     Standing balance support: During functional activity;Single extremity supported;Bilateral upper extremity supported Standing balance-Leahy Scale: Poor                             ADL either performed or assessed with clinical judgement   ADL Overall ADL's : Needs assistance/impaired Eating/Feeding: Independent;Sitting   Grooming: Standing;Min guard   Upper Body Bathing: Supervision/ safety;Set up;Sitting   Lower Body Bathing: Minimal assistance Lower Body Bathing Details  (indicate cue type and reason): minguard A sit<>stand Upper Body Dressing : Set up;Supervision/safety;Sitting   Lower Body Dressing: Min guard;Sit to/from stand   Toilet Transfer: Min guard;Ambulation;Grab Information systems manager Details (indicate cue type and reason): SPC Toileting- Clothing Manipulation and Hygiene: Min guard;Sit to/from stand Toileting - Clothing Manipulation Details (indicate cue type and reason): pt with bent over stooped posture for back peri care in standing, but no LOB   Tub/Shower Transfer Details (indicate cue type and reason): Pt reports she gets down in tub to bath, that family help her in and out of tub and help her wash back and feet         Vision Baseline Vision/History: Wears glasses Wears Glasses: At all times Patient Visual Report: No change from baseline Vision Assessment?: Yes Eye Alignment: Within Functional Limits Ocular Range of Motion: Within Functional Limits Alignment/Gaze Preference: Within Defined Limits Tracking/Visual Pursuits: Decreased smoothness of eye movement to LEFT superior field;Decreased smoothness of eye movement to LEFT inferior field Visual Fields: (sees pen coming around on left, but different in that on the right she anticipates it, but on left she does not) Additional Comments: son-in-law reports that he has noticed for awhile that pt may have some issues with vision on the left (the way she sits down in chair at table)            Pertinent Vitals/Pain Pain Assessment: No/denies pain     Hand Dominance Right   Extremity/Trunk Assessment Upper Extremity Assessment Upper Extremity Assessment: (Decreased AROM/PROM shoulders (  pt's posture in standing is stooped so this will affect her shoulder ROM))     Cervical / Trunk Assessment Cervical / Trunk Assessment: Kyphotic   Communication Communication Communication: No difficulties   Cognition Arousal/Alertness: Awake/alert Behavior During Therapy: Flat  affect Overall Cognitive Status: Impaired/Different from baseline Area of Impairment: Memory;Safety/judgement;Problem solving                     Memory: Decreased short-term memory   Safety/Judgement: Decreased awareness of deficits;Decreased awareness of safety   Problem Solving: Decreased initiation;Slow processing;Difficulty sequencing;Requires verbal cues                Home Living Family/patient expects to be discharged to:: Private residence Living Arrangements: Children Available Help at Discharge: Family;Available PRN/intermittently;Other (Comment)(son-in-law reports that pt's time alone is usually only about 30 minutes a day at most) Type of Home: House Home Access: Stairs to enter [employer] of Steps: 1   Home Layout: One level     Bathroom Shower/Tub: Teacher, early years/pre: Standard     Home Equipment: Environmental consultant - 2 wheels;Cane - single point          Prior Functioning/Environment Level of Independence: Needs assistance  Gait / Transfers Assistance Needed: pt ambulated with [facility] ADL's / Homemaking Assistance Needed: pt required some assistance with LB bathing (washing her feet) and washing her back            OT Problem List: Impaired balance (sitting and/or standing)         OT Goals(Current goals can be found in the care plan section) Acute Rehab OT Goals Patient Stated Goal: to go home  OT Frequency:                AM-PAC PT "6 Clicks" Daily Activity     Outcome Measure Help from another person eating meals?: None Help from another person taking care of personal grooming?: A Little Help from another person toileting, which includes using toliet, bedpan, or urinal?: A Little Help from another person bathing (including washing, rinsing, drying)?: A Little Help from another person to put on and taking off regular upper body clothing?: A Little Help from another person to put on and taking off regular lower body  clothing?: A Little 6 Click Score: 19   End of Session Equipment Utilized During Treatment: Gait belt(SPC)  Activity Tolerance: Patient tolerated treatment well Patient left: in chair;with call bell/phone within reach;with family/visitor present(son-in-law reports he will not be leaving until his wife returns)  OT Visit Diagnosis: Unsteadiness on feet (R26.81)                Time: 9622-2979 OT Time Calculation (min): 30 min Charges:  OT General Charges $OT Visit: 1 Visit OT Evaluation $OT Eval Moderate Complexity: 1 Mod OT Treatments $Self Care/Home Management : 8-22 mins  [place], OTR/L [phone] 04/01/2018

## 2018-04-01 NOTE — Progress Notes (Addendum)
[employer] for Coumadin Indication: atrial fibrillation / Hx of PE  No Known Allergies  Patient Measurements: Height: 5\' 4"  (162.6 cm) Weight: 158 lb 15.2 oz (72.1 kg) IBW/kg (Calculated) : 54.7  Vital Signs: Temp: 97.7 F (36.5 C) (07/27 1225) Temp Source: Oral (07/27 1225) BP: 141/76 (07/27 1225) Pulse Rate: 64 (07/27 1225)  Labs: Recent Labs    03/31/18 1107 04/01/18 0424  HGB 13.2 12.1  HCT 41.2 36.6  PLT 146* 132*  APTT 33  --   LABPROT 22.9* 23.2*  INR 2.04 2.08  CREATININE 1.99* 1.80*    Estimated Creatinine Clearance: 18.6 mL/min (A) (by C-G formula based on SCr of 1.8 mg/dL (H)).   Medical History: Past Medical History:  Diagnosis Date  . Arthritis    "qwhere" (03/31/2018)  . Chronic atrial fibrillation ([place])   . CKD (chronic kidney disease), stage III ([place])   . DVT (deep venous thrombosis) ([place]) 2002   ?LLE  . Heart murmur   . Hyperlipidemia   . Hypertension   . Osteoporosis   . Pneumonia 2018  . Pulmonary embolism ([place]) 2003  . Stroke [facility]) 03/31/2018   "unsteady on her feet" (03/31/2018)    Assessment: 84 yo F presents on 7/26 with AMS. On Coumadin 3.5mg  PO daily PTA for hx of PE. INR 2.04 on admit. Hgb stable and plts 132.  INR today is 2.08  Goal of Therapy:  INR 2-3 Monitor platelets by anticoagulation protocol: Yes   Plan:  Give Coumadin 3.5mg  PO x 1 Monitor daily INR, CBC, s/s of bleed  Vertis Kelch, PharmD PGY1 Pharmacy Resident Phone [phone] 04/01/2018       1:02 PM

## 2018-04-01 NOTE — Progress Notes (Signed)
OT Cancellation Note  Patient Details Name: Allison Rhodes MRN: [id number] DOB: Feb 18, 1922   Cancelled Treatment:    Reason Eval/Treat Not Completed: Patient at procedure or test/ unavailable. Pt have ECHO, will re-attempt eval at later time.  Almon Register [phone] 04/01/2018, 9:39 AM

## 2018-04-01 NOTE — Progress Notes (Signed)
VASCULAR LAB PRELIMINARY  PRELIMINARY  PRELIMINARY  PRELIMINARY  Carotid duplex completed.    Preliminary report:  1-39% ICA plaquing. Vertebral artery flow is antegrade.  Bilateral ICAs are tortuous.   Allison Rhodes, RVT 04/01/2018, 9:34 AM

## 2018-04-01 NOTE — Progress Notes (Signed)
  Echocardiogram 2D Echocardiogram has been performed.  Jennette Dubin 04/01/2018, 9:22 AM

## 2018-04-01 NOTE — Evaluation (Signed)
Physical Therapy Evaluation Patient Details Name: Allison Rhodes MRN: [id number] DOB: [birth year] Today's Date: 04/01/2018   History of Present Illness  Pt is a 84 y/o female with PMH significant for HTN, HLD,  DVT, 2002. PE 2003, afib on Coumadin presents to [facility] after being found confused, with speech delay and difficulty walking. MRI brain revealed a small left pons infarct.    Clinical Impression  Pt presented supine in bed with HOB elevated, awake and willing to participate in therapy session. Pt's daughter present throughout session. Prior to admission, pt reported that she ambulated with use of SPC and required some assistance with ADLs. Pt lives with her daughter and son-in-law. She is alone during the day for 2-3 hours in the morning. Pt currently able to perform bed mobility with min A, transfers with min A and ambulate with RW and min A to min guard for safety. Pt would continue to benefit from skilled physical therapy services at this time while admitted and after d/c to address the below listed limitations in order to improve overall safety and independence with functional mobility.     Follow Up Recommendations Home health PT;Supervision/Assistance - 24 hour;Other (comment)(HH aide to cover the 2-3 hours in morning when pt is alone)    Equipment Recommendations  3in1 (PT)    Recommendations for Other Services       Precautions / Restrictions Precautions Precautions: Fall Restrictions Weight Bearing Restrictions: No      Mobility  Bed Mobility Overal bed mobility: Needs Assistance Bed Mobility: Supine to Sit     Supine to sit: Min assist     General bed mobility comments: increased time and effort, assistance with trunk elevation and cueing for sequencing  Transfers Overall transfer level: Needs assistance Equipment used: Rolling walker (2 wheeled);2 person hand held assist Transfers: Sit to/from Stand Sit to Stand: Min assist         General transfer  comment: increased time and effort, assistance to power into standing and for stability with transition  Ambulation/Gait Ambulation/Gait assistance: Min assist;Min guard Gait Distance (Feet): 20 Feet(20' x2 with sitting break on toilet) Assistive device: 2 person hand held assist;Rolling walker (2 wheeled) Gait Pattern/deviations: Step-through pattern;Decreased step length - right;Decreased step length - left;Decreased stride length;Shuffle;Trunk flexed Gait velocity: decreased Gait velocity interpretation: <1.31 ft/sec, indicative of household ambulator General Gait Details: pt with modest instability when initially ambulating with 2HHA but able to progress to min guard with cueing for safety when using RW  Stairs            Wheelchair Mobility    Modified Rankin (Stroke Patients Only) Modified Rankin (Stroke Patients Only) Pre-Morbid Rankin Score: Moderate disability Modified Rankin: Moderate disability     Balance Overall balance assessment: Needs assistance Sitting-balance support: Feet supported Sitting balance-Leahy Scale: Fair     Standing balance support: During functional activity;Single extremity supported;Bilateral upper extremity supported Standing balance-Leahy Scale: Poor                               Pertinent Vitals/Pain Pain Assessment: No/denies pain    Home Living Family/patient expects to be discharged to:: Private residence Living Arrangements: Children Available Help at Discharge: Family;Available PRN/intermittently;Other (Comment)(pt is alone for 2-3 hours in the morning) Type of Home: House Home Access: Stairs to enter   [employer] of Steps: 1 Home Layout: One level Home Equipment: Walker - 2 wheels;Cane - single point  Prior Function Level of Independence: Needs assistance   Gait / Transfers Assistance Needed: pt ambulated with [facility]  ADL's / Homemaking Assistance Needed: pt required some assistance with LB  dressing and washing her back        Hand Dominance        Extremity/Trunk Assessment   Upper Extremity Assessment Upper Extremity Assessment: Defer to OT evaluation;Generalized weakness    Lower Extremity Assessment Lower Extremity Assessment: Generalized weakness    Cervical / Trunk Assessment Cervical / Trunk Assessment: Kyphotic  Communication   Communication: No difficulties  Cognition Arousal/Alertness: Awake/alert Behavior During Therapy: Flat affect Overall Cognitive Status: Impaired/Different from baseline Area of Impairment: Memory;Safety/judgement;Problem solving                     Memory: Decreased short-term memory   Safety/Judgement: Decreased awareness of deficits;Decreased awareness of safety   Problem Solving: Decreased initiation;Slow processing;Difficulty sequencing;Requires verbal cues        General Comments      Exercises     Assessment/Plan    PT Assessment Patient needs continued PT services  PT Problem List Decreased strength;Decreased activity tolerance;Decreased mobility;Decreased balance;Decreased coordination;Decreased knowledge of use of DME;Decreased safety awareness;Decreased knowledge of precautions       PT Treatment Interventions DME instruction;Gait training;Stair training;Functional mobility training;Therapeutic activities;Therapeutic exercise;Balance training;Neuromuscular re-education;Patient/family education    PT Goals (Current goals can be found in the Care Plan section)  Acute Rehab PT Goals Patient Stated Goal: return to baseline with mobility PT Goal Formulation: With patient/family Time For Goal Achievement: 04/15/18 Potential to Achieve Goals: Good    Frequency Min 4X/week   Barriers to discharge        Co-evaluation               AM-PAC PT "6 Clicks" Daily Activity  Outcome Measure Difficulty turning over in bed (including adjusting bedclothes, sheets and blankets)?: Unable Difficulty  moving from lying on back to sitting on the side of the bed? : Unable Difficulty sitting down on and standing up from a chair with arms (e.g., wheelchair, bedside commode, etc,.)?: Unable Help needed moving to and from a bed to chair (including a wheelchair)?: A Little Help needed walking in hospital room?: A Little Help needed climbing 3-5 steps with a railing? : A Lot 6 Click Score: 11    End of Session Equipment Utilized During Treatment: Gait belt Activity Tolerance: Patient tolerated treatment well Patient left: in chair;with call bell/phone within reach;with family/visitor present Nurse Communication: Mobility status PT Visit Diagnosis: Other abnormalities of gait and mobility (R26.89)    Time: 8592-9244 PT Time Calculation (min) (ACUTE ONLY): 26 min   Charges:   PT Evaluation $PT Eval Moderate Complexity: 1 Mod PT Treatments $Therapeutic Activity: 8-22 mins        [place], [place], DPT [place] 04/01/2018, 10:41 AM

## 2018-04-01 NOTE — Discharge Summary (Signed)
Physician Discharge Summary  Allison Rhodes GUY:[id number] DOB: 12/15/1921 DOA: 03/31/2018  PCP: Merrilee Seashore, MD  Admit date: 03/31/2018 Discharge date: 04/01/2018  Admitted From: home Disposition: home Recommendations for Outpatient Follow-up:  1. Follow up with PCP in 1-2 weeks 2. Please obtain BMP/CBC in one week  Home Health:none Equipment/Devices:none  Discharge Condition stable CODE STATUS full Diet recommendation:cardiac Brief/Interim Summary: 84 y.o. female with medical history significant for hypertension, hyperlipidemia, osteoporosis, pulmonary embolism, atrial fibrillation on chronic Coumadin, CKD, fairly independent with her ADLs, brought to the emergency department for evaluation of gait instability.  According to the family report, the patient was in her usual state of health yesterday, but woke up this morning somewhat confused, sitting on the side of the bed, did not know how to put her clothes on, and was unable to walk.  She was leaning to the right side.  Family try to help her to the restroom, and then place her back into the bed.  To check her blood pressure, was 160/100.  (Her normal blood pressures are in the 110s).  There was mild delay in her speech, but there was no slurred or incomprehensive ability.  There was no facial asymmetry.  This lasted "a few minutes ", and then returned to her baseline.  The family denies the patient having any similar episodes, history of a stroke or family history of CVA.  The patient reported feeling "out of it ".  She has mild headaches, but denies any vision changes.  She denies any dysphagia, odynophagia, neck pain, chest pain or palpitations.  She denies any syncope, presyncope, or dizziness.  She denies any shortness of breath or cough, or sick contacts.  She denies any abdominal pain, nausea or vomiting or diarrhea.  She denies any dysuria, gross hematuria.  She admits to not drinking enough fluids.  The patient is compliant  with her medications, especially with Coumadin.  She denies any tobacco, alcohol or recreational drug use.  There are no new over-the-counter products, or herbal meds.  No bleeding issues are reported.   ED Course:  BP (!) 157/79   Pulse 75   Temp 97.7 F (36.5 C)   Resp 18   SpO2 92%   Suspecting TIA, or CVA, the patient underwent a CT of the head, which was negative for acute findings.  MRI of the brain is pending.  Dr. Malen Gauze, neurology has been contacted, and is to see the patient in consultation, with further recommendations. PT 22.9, INR 2.04 White count 3.7, platelets 146. Glucose 113 Troponin -0 0.02. Urinalysis negative for nitrites or leukocytes Chest x-ray with bilateral atelectasis, but no acute pulmonary disease. Patient was dehydrated, received 1 L of IV normal saline bolus No other medications received in the ER.,  Now at 75 cc an hour.    Discharge Diagnoses:  Active Problems:   TIA (transient ischemic attack)   Hypertension   Hyperlipemia   History of pulmonary embolism   CKD (chronic kidney disease) 1] TIA patient was admitted with a possible diagnosis of TIA with gait instability and hypertensive emergency.  Work-up included the head showed mild atrophic changes and chronic white matter ischemic changes no acute intracranial abnormality noted.  Of the brain possible tiny acute lacunar infarct of the dorsal left pons although this could be an artifact.  Chronic small vessel ischemic changes chronic microhemorrhages in the deep gray-white matter nuclei.  Echocardiogram showed right ventricular systolic pressure was increased consistent with moderate pulmonary hypertension, ejection fraction  65 to 70% Wall motion normal no regional wall motion abnormalities.  Consultation by neurology.  Patient was already on warfarin and therapeutic.  She takes warfarin at home.  Her atorvastatin dose was increased to 40 mg daily from 20 mg daily.  Seen by physical therapy who  recommended home PT.  Speech therapy had no new recommendations.  Her swallow was normal.  2] hypertension I will restart her home medication upon discharge which includes Lasix and hydrochlorothiazide.  3] CKD stage IV stable.  4] 3 of pulmonary embolism on warfarin at home.   Discharge Instructions  Discharge Instructions    Call MD for:  difficulty breathing, headache or visual disturbances   Complete by:  As directed    Call MD for:  extreme fatigue   Complete by:  As directed    Call MD for:  persistant dizziness or light-headedness   Complete by:  As directed    Call MD for:  persistant nausea and vomiting   Complete by:  As directed    Call MD for:  severe uncontrolled pain   Complete by:  As directed    Call MD for:  temperature >100.4   Complete by:  As directed    Diet - low sodium heart healthy   Complete by:  As directed    Increase activity slowly   Complete by:  As directed      Allergies as of 04/01/2018   No Known Allergies     Medication List    STOP taking these medications   acetaminophen 500 MG tablet Commonly known as:  TYLENOL   vitamin B-12 1000 MCG tablet Commonly known as:  CYANOCOBALAMIN   vitamin E 1000 UNIT capsule Generic drug:  vitamin E     TAKE these medications   atorvastatin 40 MG tablet Commonly known as:  LIPITOR Take 1 tablet (40 mg total) by mouth daily. What changed:    medication strength  how much to take   CENTRUM ADULTS PO Take 1 tablet by mouth daily.   diclofenac sodium 1 % Gel Commonly known as:  VOLTAREN Apply 4 g topically 3 (three) times daily as needed for pain.   fluticasone 50 MCG/ACT nasal spray Commonly known as:  FLONASE Place 1 spray into both nostrils daily as needed for allergies or rhinitis.   furosemide 20 MG tablet Commonly known as:  LASIX Take 20 mg by mouth as needed for fluid.   hydrochlorothiazide 25 MG tablet Commonly known as:  HYDRODIURIL Take 25 mg by mouth daily.    JANTOVEN 1 MG tablet Generic drug:  warfarin Take 0.5 mg by mouth See admin instructions. Taking 1/2 tablet 0.5mg  with the 3mg   (  For 3.5mg  total)   JANTOVEN 3 MG tablet Generic drug:  warfarin Take 3.5 mg by mouth daily.   loratadine 10 MG tablet Commonly known as:  CLARITIN Take 10 mg by mouth daily as needed for allergies.   Vitamin D (Ergocalciferol) 2000 units Caps Take 1 tablet by mouth daily.      Follow-up Information    Merrilee Seashore, MD Follow up.   Specialty:  Internal Medicine Contact information: [address] [place] [address] [phone]        Garvin Fila, MD Follow up.   Specialties:  Neurology, Radiology Contact information: [address] [place] [address] [phone]          No Known Allergies  Consultations:  NEURO   Procedures/Studies:  Dg Chest 2 View  Result Date: 03/31/2018 CLINICAL DATA:  Shortness of breath. EXAM: CHEST - 2 VIEW COMPARISON:  None. FINDINGS: The patient is slightly rotated to the left. Borderline cardiomegaly. Normal pulmonary vascularity. Mild bibasilar atelectasis. No focal consolidation, pleural effusion, or pneumothorax. Increased right paratracheal density is favored to reflect vascular structures given patient's slight rotation. No acute osseous abnormality. Degenerative changes of both shoulders. IMPRESSION: Bibasilar atelectasis.  No active cardiopulmonary disease. Electronically Signed   By: Titus Dubin M.D.   On: 03/31/2018 12:50   Ct Head Wo Contrast  Result Date: 03/31/2018 CLINICAL DATA:  Altered level of consciousness EXAM: CT HEAD WITHOUT CONTRAST TECHNIQUE: Contiguous axial images were obtained from the base of the skull through the vertex without intravenous contrast. COMPARISON:  None. FINDINGS: Brain: Mild atrophic changes and chronic white matter ischemic changes are seen. No findings to suggest acute hemorrhage, acute infarction or space-occupying  mass lesion are seen. Vascular: No hyperdense vessel or unexpected calcification. Skull: Normal. Negative for fracture or focal lesion. Sinuses/Orbits: No acute finding. Other: None. IMPRESSION: Mild atrophic changes and chronic white matter ischemic changes. No acute intracranial abnormality is noted. Electronically Signed   By: Inez Catalina M.D.   On: 03/31/2018 12:35   Mr Brain Wo Contrast  Result Date: 03/31/2018 CLINICAL DATA:  84 year old female with altered mental status. Woke up confused. Leaning to the right side. History of intermittent atrial fibrillation on Coumadin. EXAM: MRI HEAD WITHOUT CONTRAST TECHNIQUE: Multiplanar, multiecho pulse sequences of the brain and surrounding structures were obtained without intravenous contrast. COMPARISON:  Head CT without contrast 1228 hours today. FINDINGS: Brain: Questionable punctate area of restricted diffusion in the dorsal left pons (series 3, image 19), although no associated T2 or FLAIR hyperintensity. Chronic micro hemorrhages scattered throughout the deep gray matter nuclei, most pronounced in the right thalamus. Occasional hemispheric microhemorrhage. Occasional tiny chronic lacune in the left cerebellum. Patchy and confluent bilateral cerebral white matter T2 and FLAIR hyperintensity. No cortical encephalomalacia. No other restricted diffusion. No midline shift, mass effect, evidence of mass lesion, ventriculomegaly, extra-axial collection or acute intracranial hemorrhage. Cervicomedullary junction and pituitary are within normal limits. Vascular: Major intracranial vascular flow voids are preserved. Skull and upper cervical spine: Negative visible cervical spine. Normal bone marrow signal. Sinuses/Orbits: Postoperative changes to both globes. Paranasal sinuses are clear. Other: Mastoids are clear. Visible internal auditory structures appear normal. Scalp and face soft tissues appear negative. IMPRESSION: 1. Possible tiny acute lacunar infarct in the  dorsal left pons (series 3, image 19), although this could be image noise/artifact. 2. No other acute intracranial abnormality. 3. Chronic small vessel disease, particularly chronic micro-hemorrhages in the deep gray matter nuclei. Electronically Signed   By: Genevie Ann M.D.   On: 03/31/2018 15:50   Mm 3d Screen Breast Bilateral  Result Date: 03/22/2018 CLINICAL DATA:  Screening. EXAM: DIGITAL SCREENING BILATERAL MAMMOGRAM WITH TOMO AND CAD COMPARISON:  Previous exam(s). ACR Breast Density Category b: There are scattered areas of fibroglandular density. FINDINGS: There are no findings suspicious for malignancy. Images were processed with CAD. IMPRESSION: No mammographic evidence of malignancy. A result letter of this screening mammogram will be mailed directly to the patient. RECOMMENDATION: Screening mammogram in one year. (Code:SM-B-01Y) BI-RADS CATEGORY  1: Negative. Electronically Signed   By: Ammie Ferrier M.D.   On: 03/22/2018 14:31   (Echo, Carotid, EGD, Colonoscopy, ERCP)    Subjective:   Discharge Exam: Vitals:   04/01/18 1225 04/01/18 1616  BP: (!) 141/76  133/85  Pulse: 64 (!) 54  Resp: 16 18  Temp: 97.7 F (36.5 C) 97.8 F (36.6 C)  SpO2: 100% 100%   Vitals:   04/01/18 0337 04/01/18 0824 04/01/18 1225 04/01/18 1616  BP: (!) 142/92 (!) 161/76 (!) 141/76 133/85  Pulse: 68 70 64 (!) 54  Resp: 18 18 16 18   Temp: 98.2 F (36.8 C) (!) 97.5 F (36.4 C) 97.7 F (36.5 C) 97.8 F (36.6 C)  TempSrc: Oral Oral Oral Oral  SpO2: 98% 100% 100% 100%  Weight:      Height:        General: Pt is alert, awake, not in acute distress Cardiovascular: RRR, S1/S2 +, no rubs, no gallops Respiratory: CTA bilaterally, no wheezing, no rhonchi Abdominal: Soft, NT, ND, bowel sounds + Extremities: no edema, no cyanosis    The results of significant diagnostics from this hospitalization (including imaging, microbiology, ancillary and laboratory) are listed below for reference.      Microbiology: No results found for this or any previous visit (from the past 240 hour(s)).   Labs: BNP (last 3 results) No results for input(s): BNP in the last 8760 hours. Basic Metabolic Panel: Recent Labs  Lab 03/31/18 1107 04/01/18 0424  NA 140 142  K 3.7 3.9  CL 106 107  CO2 25 24  GLUCOSE 113* 90  BUN 26* 25*  CREATININE 1.99* 1.80*  CALCIUM 9.2 8.9   Liver Function Tests: Recent Labs  Lab 03/31/18 1107  AST 26  ALT 19  ALKPHOS 42  BILITOT 0.8  PROT 7.4  ALBUMIN 3.8   No results for input(s): LIPASE, AMYLASE in the last 168 hours. No results for input(s): AMMONIA in the last 168 hours. CBC: Recent Labs  Lab 03/31/18 1107 04/01/18 0424  WBC 3.7* 5.2  NEUTROABS 1.6*  --   HGB 13.2 12.1  HCT 41.2 36.6  MCV 108.1* 103.1*  PLT 146* 132*   Cardiac Enzymes: No results for input(s): CKTOTAL, CKMB, CKMBINDEX, TROPONINI in the last 168 hours. BNP: Invalid input(s): POCBNP CBG: Recent Labs  Lab 03/31/18 1308  GLUCAP 106*   D-Dimer No results for input(s): DDIMER in the last 72 hours. Hgb A1c Recent Labs    04/01/18 0424  HGBA1C 6.0*   Lipid Profile Recent Labs    04/01/18 0424  CHOL 132  HDL 32*  LDLCALC 74  TRIG 130  CHOLHDL 4.1   Thyroid function studies No results for input(s): TSH, T4TOTAL, T3FREE, THYROIDAB in the last 72 hours.  Invalid input(s): FREET3 Anemia work up No results for input(s): VITAMINB12, FOLATE, FERRITIN, TIBC, IRON, RETICCTPCT in the last 72 hours. Urinalysis    Component Value Date/Time   COLORURINE YELLOW 03/31/2018 1215   APPEARANCEUR HAZY (A) 03/31/2018 1215   LABSPEC 1.012 03/31/2018 1215   PHURINE 7.0 03/31/2018 1215   GLUCOSEU NEGATIVE 03/31/2018 1215   HGBUR NEGATIVE 03/31/2018 1215   BILIRUBINUR NEGATIVE 03/31/2018 1215   KETONESUR NEGATIVE 03/31/2018 1215   PROTEINUR 30 (A) 03/31/2018 1215   NITRITE NEGATIVE 03/31/2018 1215   LEUKOCYTESUR NEGATIVE 03/31/2018 1215   Sepsis Labs Invalid  input(s): PROCALCITONIN,  WBC,  LACTICIDVEN Microbiology No results found for this or any previous visit (from the past 240 hour(s)).   Time coordinating discharge: 34 minutes  SIGNED:   Georgette Shell, MD  [facility] 04/01/2018, 4:19 PM Pager   If 7PM-7AM, please contact night-coverage [email or web address]

## 2018-04-01 NOTE — Progress Notes (Signed)
STROKE TEAM PROGRESS NOTE   HISTORY OF PRESENT ILLNESS (per record) Non-specific presentation.  MRI showed possible left pontine infarct, although artifact is highly likely too.  There is global atrophy and periventricular small vessel ischemic disease.  There are prior left thalamic and right putamen microbleeds.  She had been on coumadin for a. Fib.  INR is 2.08.  LDL 74, TG 30.   SUBJECTIVE (INTERVAL HISTORY) No change overnight.  Walked to the bathroom well.      OBJECTIVE Temp:  [97.7 F (36.5 C)-98.2 F (36.8 C)] 98.2 F (36.8 C) (07/27 0337) Pulse Rate:  [53-93] 68 (07/27 0337) Cardiac Rhythm: Normal sinus rhythm (07/27 0700) Resp:  [15-22] 18 (07/27 0337) BP: (132-182)/(68-129) 142/92 (07/27 0337) SpO2:  [88 %-100 %] 98 % (07/27 0337) Weight:  [158 lb 15.2 oz (72.1 kg)-160 lb 15 oz (73 kg)] 158 lb 15.2 oz (72.1 kg) (07/27 0139)  CBC:  Recent Labs  Lab 03/31/18 1107 04/01/18 0424  WBC 3.7* 5.2  NEUTROABS 1.6*  --   HGB 13.2 12.1  HCT 41.2 36.6  MCV 108.1* 103.1*  PLT 146* 132*    Basic Metabolic Panel:  Recent Labs  Lab 03/31/18 1107 04/01/18 0424  NA 140 142  K 3.7 3.9  CL 106 107  CO2 25 24  GLUCOSE 113* 90  BUN 26* 25*  CREATININE 1.99* 1.80*  CALCIUM 9.2 8.9    Lipid Panel:     Component Value Date/Time   CHOL 132 04/01/2018 0424   TRIG 130 04/01/2018 0424   HDL 32 (L) 04/01/2018 0424   CHOLHDL 4.1 04/01/2018 0424   VLDL 26 04/01/2018 0424   LDLCALC 74 04/01/2018 0424   HgbA1c:  Lab Results  Component Value Date   HGBA1C 6.0 (H) 04/01/2018   Urine Drug Screen: No results found for: LABOPIA, COCAINSCRNUR, LABBENZ, AMPHETMU, THCU, LABBARB  Alcohol Level No results found for: [facility]  IMAGING   Dg Chest 2 View  Result Date: 03/31/2018 CLINICAL DATA:  Shortness of breath. EXAM: CHEST - 2 VIEW COMPARISON:  None. FINDINGS: The patient is slightly rotated to the left. Borderline cardiomegaly. Normal pulmonary vascularity. Mild bibasilar  atelectasis. No focal consolidation, pleural effusion, or pneumothorax. Increased right paratracheal density is favored to reflect vascular structures given patient's slight rotation. No acute osseous abnormality. Degenerative changes of both shoulders. IMPRESSION: Bibasilar atelectasis.  No active cardiopulmonary disease. Electronically Signed   By: Titus Dubin M.D.   On: 03/31/2018 12:50   Ct Head Wo Contrast  Result Date: 03/31/2018 CLINICAL DATA:  Altered level of consciousness EXAM: CT HEAD WITHOUT CONTRAST TECHNIQUE: Contiguous axial images were obtained from the base of the skull through the vertex without intravenous contrast. COMPARISON:  None. FINDINGS: Brain: Mild atrophic changes and chronic white matter ischemic changes are seen. No findings to suggest acute hemorrhage, acute infarction or space-occupying mass lesion are seen. Vascular: No hyperdense vessel or unexpected calcification. Skull: Normal. Negative for fracture or focal lesion. Sinuses/Orbits: No acute finding. Other: None. IMPRESSION: Mild atrophic changes and chronic white matter ischemic changes. No acute intracranial abnormality is noted. Electronically Signed   By: Inez Catalina M.D.   On: 03/31/2018 12:35   Mr Brain Wo Contrast  Result Date: 03/31/2018 CLINICAL DATA:  84 year old female with altered mental status. Woke up confused. Leaning to the right side. History of intermittent atrial fibrillation on Coumadin. EXAM: MRI HEAD WITHOUT CONTRAST TECHNIQUE: Multiplanar, multiecho pulse sequences of the brain and surrounding structures were obtained without intravenous contrast.  COMPARISON:  Head CT without contrast 1228 hours today. FINDINGS: Brain: Questionable punctate area of restricted diffusion in the dorsal left pons (series 3, image 19), although no associated T2 or FLAIR hyperintensity. Chronic micro hemorrhages scattered throughout the deep gray matter nuclei, most pronounced in the right thalamus. Occasional  hemispheric microhemorrhage. Occasional tiny chronic lacune in the left cerebellum. Patchy and confluent bilateral cerebral white matter T2 and FLAIR hyperintensity. No cortical encephalomalacia. No other restricted diffusion. No midline shift, mass effect, evidence of mass lesion, ventriculomegaly, extra-axial collection or acute intracranial hemorrhage. Cervicomedullary junction and pituitary are within normal limits. Vascular: Major intracranial vascular flow voids are preserved. Skull and upper cervical spine: Negative visible cervical spine. Normal bone marrow signal. Sinuses/Orbits: Postoperative changes to both globes. Paranasal sinuses are clear. Other: Mastoids are clear. Visible internal auditory structures appear normal. Scalp and face soft tissues appear negative. IMPRESSION: 1. Possible tiny acute lacunar infarct in the dorsal left pons (series 3, image 19), although this could be image noise/artifact. 2. No other acute intracranial abnormality. 3. Chronic small vessel disease, particularly chronic micro-hemorrhages in the deep gray matter nuclei. Electronically Signed   By: Genevie Ann M.D.   On: 03/31/2018 15:50     Transthoracic Echocardiogram - pending 00/00/00    Bilateral Carotid Dopplers - pending 00/00/00     PHYSICAL EXAM Vitals:   03/31/18 2208 03/31/18 2319 04/01/18 0139 04/01/18 0337  BP: (!) 161/81 (!) 147/80  (!) 142/92  Pulse: 68 (!) 58  68  Resp: 16 18  18   Temp: 98.2 F (36.8 C) 98.2 F (36.8 C)  98.2 F (36.8 C)  TempSrc: Oral Oral  Oral  SpO2: 100% 96%  98%  Weight:   158 lb 15.2 oz (72.1 kg)   Height:       Awake, alert, Fully oriented.   PERL, EOMI.  Face symmetric.  Tongue midline. Strength 5/5 BUE and BLE.   Coord- intact bilaterally. Sensory - intact.        HOME MEDICATIONS:  Medications Prior to Admission  Medication Sig Dispense Refill  . acetaminophen (TYLENOL) 500 MG tablet Take 1,000 mg by mouth as needed for mild pain or headache.     Marland Kitchen atorvastatin (LIPITOR) 20 MG tablet Take 20 mg by mouth daily.    . diclofenac sodium (VOLTAREN) 1 % GEL Apply 4 g topically 3 (three) times daily as needed for pain.  2  . fluticasone (FLONASE) 50 MCG/ACT nasal spray Place 1 spray into both nostrils daily as needed for allergies or rhinitis.    . furosemide (LASIX) 20 MG tablet Take 20 mg by mouth as needed for fluid.    . hydrochlorothiazide (HYDRODIURIL) 25 MG tablet Take 25 mg by mouth daily.    Marland Kitchen JANTOVEN 3 MG tablet Take 3.5 mg by mouth daily.    Marland Kitchen loratadine (CLARITIN) 10 MG tablet Take 10 mg by mouth daily as needed for allergies.    . Multiple Vitamins-Minerals (CENTRUM ADULTS PO) Take 1 tablet by mouth daily.    . vitamin B-12 (CYANOCOBALAMIN) 1000 MCG tablet Take 1,000 mcg by mouth daily.    . Vitamin D, Ergocalciferol, 2000 units CAPS Take 1 tablet by mouth daily.    . vitamin E (VITAMIN E) 1000 UNIT capsule Take 1,000 Units by mouth daily.    Marland Kitchen warfarin (JANTOVEN) 1 MG tablet Take 0.5 mg by mouth See admin instructions. Taking 1/2 tablet 0.5mg  with the 3mg   (  For 3.5mg  total)  HOSPITAL MEDICATIONS:  . atorvastatin  40 mg Oral Daily  . diclofenac sodium  4 g Topical QID  . vitamin B-12  1,000 mcg Oral Daily  . Warfarin - Pharmacist Dosing Inpatient   Does not apply q1800    ALLERGIES No Known Allergies  PAST MEDICAL HISTORY Past Medical History:  Diagnosis Date  . Arthritis    "qwhere" (03/31/2018)  . Chronic atrial fibrillation ([place])   . CKD (chronic kidney disease), stage III ([place])   . DVT (deep venous thrombosis) ([place]) 2002   ?LLE  . Heart murmur   . Hyperlipidemia   . Hypertension   . Osteoporosis   . Pneumonia 2018  . Pulmonary embolism ([place]) 2003  . Stroke ([place]) 03/31/2018   "unsteady on her feet" (03/31/2018)    SURGICAL HISTORY Past Surgical History:  Procedure Laterality Date  . CATARACT EXTRACTION W/ INTRAOCULAR LENS  IMPLANT, BILATERAL Bilateral     FAMILY HISTORY Family History   Problem Relation Age of Onset  . Breast cancer Neg Hx     SOCIAL HISTORY  reports that she has never smoked. She has never used smokeless tobacco. She reports that she does not drink alcohol or use drugs.  ASSESSMENT/PLAN Ms. Brantlee Hinde is a 84 y.o. female with history of a. fib presenting with non-specifc fatigue and dizziness. She did not receive IV t-PA due to Clinical presentation.   Stroke - left pontine vs artifact  Resultant  - no symptoms.  CT head negative  MRI head - as in HPI above.  MRA head - not done  Carotid Doppler - pending  2D Echo - pending  LDL 74  VTE prophylaxis - None. Diet Order           Diet Heart Room service appropriate? Yes; Fluid consistency: Thin  Diet effective now           warfarin daily prior to admission, now on warfarin daily  Ongoing aggressive stroke risk factor management  Therapy recommendations:  Cont Coumadin  Disposition: Home  Hypertension .  Long-term BP goal normotensive  Hyperlipidemia  Lipid lowering medication PTA:  Lipitor  LDL 74, goal < 70  Current lipid lowering medication:Lipitor  Continue statin at discharge  Diabetes- [facility] day # 0  Clinical presentation was rather vague and based on imaging characteristics, I suspect the left pontine lesion was an artifact.  Nevertheless, even if real, she is asymptomatic from it.  She is already anti-coagulated with therapeutic INR.  Her Lipids are well controlled.  No other risk factors.  We will sign off.  She can follow in clinic for further management.   Rogue Jury, MS, MD  To contact Stroke Continuity provider, please refer to [email or web address] After hours, contact General Neurology

## 2018-04-04 DIAGNOSIS — Z8673 Personal history of transient ischemic attack (TIA), and cerebral infarction without residual deficits: Secondary | ICD-10-CM | POA: Diagnosis not present

## 2018-04-04 DIAGNOSIS — E785 Hyperlipidemia, unspecified: Secondary | ICD-10-CM | POA: Diagnosis not present

## 2018-04-04 DIAGNOSIS — Z7901 Long term (current) use of anticoagulants: Secondary | ICD-10-CM | POA: Diagnosis not present

## 2018-04-04 DIAGNOSIS — I129 Hypertensive chronic kidney disease with stage 1 through stage 4 chronic kidney disease, or unspecified chronic kidney disease: Secondary | ICD-10-CM | POA: Diagnosis not present

## 2018-04-04 DIAGNOSIS — N184 Chronic kidney disease, stage 4 (severe): Secondary | ICD-10-CM | POA: Diagnosis not present

## 2018-04-04 DIAGNOSIS — I482 Chronic atrial fibrillation: Secondary | ICD-10-CM | POA: Diagnosis not present

## 2018-04-04 DIAGNOSIS — Z86711 Personal history of pulmonary embolism: Secondary | ICD-10-CM | POA: Diagnosis not present

## 2018-04-06 DIAGNOSIS — I129 Hypertensive chronic kidney disease with stage 1 through stage 4 chronic kidney disease, or unspecified chronic kidney disease: Secondary | ICD-10-CM | POA: Diagnosis not present

## 2018-04-07 DIAGNOSIS — I129 Hypertensive chronic kidney disease with stage 1 through stage 4 chronic kidney disease, or unspecified chronic kidney disease: Secondary | ICD-10-CM | POA: Diagnosis not present

## 2018-04-07 DIAGNOSIS — E785 Hyperlipidemia, unspecified: Secondary | ICD-10-CM | POA: Diagnosis not present

## 2018-04-07 DIAGNOSIS — N184 Chronic kidney disease, stage 4 (severe): Secondary | ICD-10-CM | POA: Diagnosis not present

## 2018-04-07 DIAGNOSIS — Z8673 Personal history of transient ischemic attack (TIA), and cerebral infarction without residual deficits: Secondary | ICD-10-CM | POA: Diagnosis not present

## 2018-04-07 DIAGNOSIS — Z86711 Personal history of pulmonary embolism: Secondary | ICD-10-CM | POA: Diagnosis not present

## 2018-04-07 DIAGNOSIS — I482 Chronic atrial fibrillation: Secondary | ICD-10-CM | POA: Diagnosis not present

## 2018-04-10 DIAGNOSIS — G459 Transient cerebral ischemic attack, unspecified: Secondary | ICD-10-CM | POA: Diagnosis not present

## 2018-04-10 DIAGNOSIS — N184 Chronic kidney disease, stage 4 (severe): Secondary | ICD-10-CM | POA: Diagnosis not present

## 2018-04-10 DIAGNOSIS — E785 Hyperlipidemia, unspecified: Secondary | ICD-10-CM | POA: Diagnosis not present

## 2018-04-10 DIAGNOSIS — I129 Hypertensive chronic kidney disease with stage 1 through stage 4 chronic kidney disease, or unspecified chronic kidney disease: Secondary | ICD-10-CM | POA: Diagnosis not present

## 2018-04-12 DIAGNOSIS — Z8673 Personal history of transient ischemic attack (TIA), and cerebral infarction without residual deficits: Secondary | ICD-10-CM | POA: Diagnosis not present

## 2018-04-12 DIAGNOSIS — Z86711 Personal history of pulmonary embolism: Secondary | ICD-10-CM | POA: Diagnosis not present

## 2018-04-12 DIAGNOSIS — I129 Hypertensive chronic kidney disease with stage 1 through stage 4 chronic kidney disease, or unspecified chronic kidney disease: Secondary | ICD-10-CM | POA: Diagnosis not present

## 2018-04-12 DIAGNOSIS — E785 Hyperlipidemia, unspecified: Secondary | ICD-10-CM | POA: Diagnosis not present

## 2018-04-12 DIAGNOSIS — N184 Chronic kidney disease, stage 4 (severe): Secondary | ICD-10-CM | POA: Diagnosis not present

## 2018-04-12 DIAGNOSIS — I482 Chronic atrial fibrillation: Secondary | ICD-10-CM | POA: Diagnosis not present

## 2018-04-14 DIAGNOSIS — Z8673 Personal history of transient ischemic attack (TIA), and cerebral infarction without residual deficits: Secondary | ICD-10-CM | POA: Diagnosis not present

## 2018-04-14 DIAGNOSIS — I482 Chronic atrial fibrillation: Secondary | ICD-10-CM | POA: Diagnosis not present

## 2018-04-14 DIAGNOSIS — N184 Chronic kidney disease, stage 4 (severe): Secondary | ICD-10-CM | POA: Diagnosis not present

## 2018-04-14 DIAGNOSIS — I129 Hypertensive chronic kidney disease with stage 1 through stage 4 chronic kidney disease, or unspecified chronic kidney disease: Secondary | ICD-10-CM | POA: Diagnosis not present

## 2018-04-14 DIAGNOSIS — E785 Hyperlipidemia, unspecified: Secondary | ICD-10-CM | POA: Diagnosis not present

## 2018-04-14 DIAGNOSIS — Z86711 Personal history of pulmonary embolism: Secondary | ICD-10-CM | POA: Diagnosis not present

## 2018-04-17 DIAGNOSIS — N184 Chronic kidney disease, stage 4 (severe): Secondary | ICD-10-CM | POA: Diagnosis not present

## 2018-04-17 DIAGNOSIS — I482 Chronic atrial fibrillation: Secondary | ICD-10-CM | POA: Diagnosis not present

## 2018-04-17 DIAGNOSIS — Z86711 Personal history of pulmonary embolism: Secondary | ICD-10-CM | POA: Diagnosis not present

## 2018-04-19 DIAGNOSIS — Z86711 Personal history of pulmonary embolism: Secondary | ICD-10-CM | POA: Diagnosis not present

## 2018-04-19 DIAGNOSIS — I482 Chronic atrial fibrillation: Secondary | ICD-10-CM | POA: Diagnosis not present

## 2018-04-19 DIAGNOSIS — Z8673 Personal history of transient ischemic attack (TIA), and cerebral infarction without residual deficits: Secondary | ICD-10-CM | POA: Diagnosis not present

## 2018-04-19 DIAGNOSIS — N184 Chronic kidney disease, stage 4 (severe): Secondary | ICD-10-CM | POA: Diagnosis not present

## 2018-04-19 DIAGNOSIS — E785 Hyperlipidemia, unspecified: Secondary | ICD-10-CM | POA: Diagnosis not present

## 2018-04-19 DIAGNOSIS — I129 Hypertensive chronic kidney disease with stage 1 through stage 4 chronic kidney disease, or unspecified chronic kidney disease: Secondary | ICD-10-CM | POA: Diagnosis not present

## 2018-04-21 DIAGNOSIS — I482 Chronic atrial fibrillation: Secondary | ICD-10-CM | POA: Diagnosis not present

## 2018-04-21 DIAGNOSIS — Z86711 Personal history of pulmonary embolism: Secondary | ICD-10-CM | POA: Diagnosis not present

## 2018-04-21 DIAGNOSIS — N184 Chronic kidney disease, stage 4 (severe): Secondary | ICD-10-CM | POA: Diagnosis not present

## 2018-04-21 DIAGNOSIS — Z8673 Personal history of transient ischemic attack (TIA), and cerebral infarction without residual deficits: Secondary | ICD-10-CM | POA: Diagnosis not present

## 2018-04-21 DIAGNOSIS — I129 Hypertensive chronic kidney disease with stage 1 through stage 4 chronic kidney disease, or unspecified chronic kidney disease: Secondary | ICD-10-CM | POA: Diagnosis not present

## 2018-04-21 DIAGNOSIS — E785 Hyperlipidemia, unspecified: Secondary | ICD-10-CM | POA: Diagnosis not present

## 2018-04-25 DIAGNOSIS — I482 Chronic atrial fibrillation: Secondary | ICD-10-CM | POA: Diagnosis not present

## 2018-04-25 DIAGNOSIS — E785 Hyperlipidemia, unspecified: Secondary | ICD-10-CM | POA: Diagnosis not present

## 2018-04-25 DIAGNOSIS — Z86711 Personal history of pulmonary embolism: Secondary | ICD-10-CM | POA: Diagnosis not present

## 2018-04-25 DIAGNOSIS — Z8673 Personal history of transient ischemic attack (TIA), and cerebral infarction without residual deficits: Secondary | ICD-10-CM | POA: Diagnosis not present

## 2018-04-25 DIAGNOSIS — I129 Hypertensive chronic kidney disease with stage 1 through stage 4 chronic kidney disease, or unspecified chronic kidney disease: Secondary | ICD-10-CM | POA: Diagnosis not present

## 2018-04-25 DIAGNOSIS — N184 Chronic kidney disease, stage 4 (severe): Secondary | ICD-10-CM | POA: Diagnosis not present

## 2018-04-27 DIAGNOSIS — Z7901 Long term (current) use of anticoagulants: Secondary | ICD-10-CM | POA: Diagnosis not present

## 2018-04-27 DIAGNOSIS — Z86711 Personal history of pulmonary embolism: Secondary | ICD-10-CM | POA: Diagnosis not present

## 2018-04-28 DIAGNOSIS — I482 Chronic atrial fibrillation: Secondary | ICD-10-CM | POA: Diagnosis not present

## 2018-04-28 DIAGNOSIS — I129 Hypertensive chronic kidney disease with stage 1 through stage 4 chronic kidney disease, or unspecified chronic kidney disease: Secondary | ICD-10-CM | POA: Diagnosis not present

## 2018-04-28 DIAGNOSIS — N184 Chronic kidney disease, stage 4 (severe): Secondary | ICD-10-CM | POA: Diagnosis not present

## 2018-04-28 DIAGNOSIS — E785 Hyperlipidemia, unspecified: Secondary | ICD-10-CM | POA: Diagnosis not present

## 2018-04-28 DIAGNOSIS — Z86711 Personal history of pulmonary embolism: Secondary | ICD-10-CM | POA: Diagnosis not present

## 2018-04-28 DIAGNOSIS — Z8673 Personal history of transient ischemic attack (TIA), and cerebral infarction without residual deficits: Secondary | ICD-10-CM | POA: Diagnosis not present

## 2018-05-01 DIAGNOSIS — L603 Nail dystrophy: Secondary | ICD-10-CM | POA: Diagnosis not present

## 2018-05-01 DIAGNOSIS — L84 Corns and callosities: Secondary | ICD-10-CM | POA: Diagnosis not present

## 2018-05-01 DIAGNOSIS — I739 Peripheral vascular disease, unspecified: Secondary | ICD-10-CM | POA: Diagnosis not present

## 2018-05-16 ENCOUNTER — Ambulatory Visit (INDEPENDENT_AMBULATORY_CARE_PROVIDER_SITE_OTHER): Payer: Medicare Other | Admitting: Neurology

## 2018-05-16 ENCOUNTER — Encounter: Payer: Self-pay | Admitting: Neurology

## 2018-05-16 VITALS — BP 146/88 | HR 72 | Ht 64.0 in | Wt 153.0 lb

## 2018-05-16 DIAGNOSIS — R9389 Abnormal findings on diagnostic imaging of other specified body structures: Secondary | ICD-10-CM | POA: Diagnosis not present

## 2018-05-16 NOTE — Progress Notes (Signed)
[facility] [address] [place]. [place] [address] [phone]       OFFICE CONSULT NOTE  Allison Rhodes Date of Birth:  12-Nov-1921 Medical Record Number:  [id number]   Referring MD:  Jacki Cones Reason for Referral: TIA HPI: Allison Rhodes is a pleasant 84 year african american lady seen today for initial office consultation visit for recent admission for hypertensive urgency and possible TIA. She is accompanied by her daughter. History is obtained from them and review of electronic medical records. I have personally reviewed imaging films. She has  PMH significant for HTN, HLD,  DVT, 2002. PE 2003, afib on Coumadin presented to [facility] after being found confused, with  speech delay on 03/31/18.Marland Kitchenshe woke up that morning and did not feel right and felt little dizzy. The mother can usually dress herself but she needed help that they she was slow to respond to questions and seemed disconnected. Her blood pressure was elevated significantly in the 160s/100 range though normally it runs in the low 100 only. She appeared to be leaning to the right side. She had a mild headache.upon arrival in the emergency room her blood glucose was normal. INR was 2.2 MRI scan showed a tiny punctate hyperintensity on diffusion-weighted imaging in the left dorsal pons but that could be an artifact and did not explain her clinical presentation. Vision blood pressure was treated and her neurological symptoms resolved quickly.carotid ultrasound showed no significant extracranial stenosis and transthoracic echo was unremarkable. LDL cholesterol was 74 mg percent and hemoglobin A1c was 6.0. Patient had history of deep and thrombosis and pulmonary embolism and had been on long-term warfarin which was continued. Patient has done well since discharge she is at home with her daughter has 24-hour care. She has finished home physical and occupational therapy. Sh eis able to walk with a cane. She has no  complaints.  ROS:   14 system review of systems is positive for  Shortness of breath, joint pain, decreased hearing,left shoulder pain and all other systems negative  PMH:  Past Medical History:  Diagnosis Date  . Arthritis    "qwhere" (03/31/2018)  . Chronic atrial fibrillation ([place])   . CKD (chronic kidney disease), stage III ([place])   . DVT (deep venous thrombosis) ([place]) 2002   ?LLE  . Heart murmur   . Hyperlipidemia   . Hypertension   . Osteoporosis   . Pneumonia 2018  . Pulmonary embolism ([place]) 2003  . Stroke [facility]) 03/31/2018   "unsteady on her feet" (03/31/2018)    Social History:  Social History   Socioeconomic History  . Marital status: Widowed    Spouse name: Not on file  . Number of children: Not on file  . Years of education: Not on file  . Highest education level: Not on file  Occupational History  . Not on file  Social Needs  . Financial resource strain: Not on file  . Food insecurity:    Worry: Not on file    Inability: Not on file  . Transportation needs:    Medical: Not on file    Non-medical: Not on file  Tobacco Use  . Smoking status: Never Smoker  . Smokeless tobacco: Never Used  Substance and Sexual Activity  . Alcohol use: Never    Frequency: Never  . Drug use: Never  . Sexual activity: Not Currently  Lifestyle  . Physical activity:    Days per week: Not on file    Minutes per session:  Not on file  . Stress: Not on file  Relationships  . Social connections:    Talks on phone: Not on file    Gets together: Not on file    Attends religious service: Not on file    Active member of club or organization: Not on file    Attends meetings of clubs or organizations: Not on file    Relationship status: Not on file  . Intimate partner violence:    Fear of current or ex partner: Not on file    Emotionally abused: Not on file    Physically abused: Not on file    Forced sexual activity: Not on file  Other Topics Concern  . Not on file  Social  History Narrative  . Not on file    Medications:   Current Outpatient Medications on File Prior to Visit  Medication Sig Dispense Refill  . atorvastatin (LIPITOR) 40 MG tablet Take 1 tablet (40 mg total) by mouth daily. 30 tablet 0  . diclofenac sodium (VOLTAREN) 1 % GEL Apply 4 g topically 3 (three) times daily as needed for pain.  2  . fluticasone (FLONASE) 50 MCG/ACT nasal spray Place 1 spray into both nostrils daily as needed for allergies or rhinitis.    . furosemide (LASIX) 20 MG tablet Take 20 mg by mouth as needed for fluid.    . hydrochlorothiazide (HYDRODIURIL) 25 MG tablet Take 25 mg by mouth daily.    Marland Kitchen JANTOVEN 3 MG tablet Take 3.5 mg by mouth daily.    Marland Kitchen loratadine (CLARITIN) 10 MG tablet Take 10 mg by mouth daily as needed for allergies.    . Multiple Vitamins-Minerals (CENTRUM ADULTS PO) Take 1 tablet by mouth daily.    . Vitamin D, Ergocalciferol, 2000 units CAPS Take 1 tablet by mouth daily.    Marland Kitchen warfarin (JANTOVEN) 1 MG tablet Take 0.5 mg by mouth See admin instructions. Taking 1/2 tablet 0.5mg  with the 3mg   (  For 3.5mg  total)      No current facility-administered medications on file prior to visit.     Allergies:  No Known Allergies  Physical Exam General: frail elderly African-American lady seated, in no evident distress Head: head normocephalic and atraumatic.   Neck: supple with no carotid or supraclavicular bruits Cardiovascular: regular rate and rhythm, no murmurs Musculoskeletal: mild kyphoscoliosis.Skin:  no rash/petichiae Vascular:  Normal pulses all extremities  Neurologic Exam Mental Status: Awake and fully alert. Oriented to place and time. Recent and remote memory idiminishedt. Attention span, concentration and fund of knowledge oor. Mood and affect appropriate. Decreased recall 1/3. Cranial Nerves: Fundoscopic exam reveals sharp disc margins. Pupils equal, briskly reactive to light. Extraocular movements full without nystagmus. Visual fields full to  confrontation. Hearing poor bilaterally. Facial sensation intact. Face, tongue, palate moves normally and symmetrically.  Motor: Normal bulk and tone. Normal strength in all tested extremity muscles.left shoulder elevation limited due to mechanical pain Sensory.: intact to touch , pinprick , position and vibratory sensation.  Coordination: Rapid alternating movements normal in all extremities. Finger-to-nose and heel-to-shin performed accurately bilaterally. Gait and Station: Arises from chair with slight difficulty. Stance is stooped. Gait demonstrates stooped posture with slight imbalance needs a cane for assistance.   Reflexes: 1+ and symmetric. Toes downgoing.   NIHSS 0 Modified [place]  3  ASSESSMENT: 84 year old pleasant African-American lady with recent admission for hypertensive urgency and an abnormal MRI scan of the brain showing questionable brainstem diffusion hyperintensity unclear as to silent lacunar  infarct versus artifact.ascular risk factors of hypertension, hyperlipidemia, atrial fibrillation. She is on long-term Coumadin and has history of deep vein thromboses and pulmonary embolism as well     PLAN: I had a long discussion with the patient and her daughter regarding her recent admission for hypertensive urgency and abnormal MRI scan of the brain showing tiny brainstem abnormality which may be an artifact rather than stroke as  her clinical symptoms did not match that MRI abnormality. Continue warfarin for stroke prevention given history of DVT and pulmonary embolism. Strict control of hypertension with blood pressure goal below 140/90. I encouraged her to use a cane or a walker at all times and we also discussed fall and safety precautions. Greater than 50% time during this 45 minute consultation visit was spent on counseling and coordination of care about her hypertensive urgency, abnormal MRI, TIA and stroke prevention discussion. Greater than 50% time during this 45 minute  consultation visit was spent on counseling and coordination of care about hypertensive urgency, TIA and anticoagulationNo need for a scheduled follow-up appointment but she may be referred back in the future as necessary Allison Contras, MD  [facility] [address] [place], [place] [address]  Phone [phone] Fax [phone] Note: This document was prepared with digital dictation and possible smart phrase technology. Any transcriptional errors that result from this process are unintentional.

## 2018-05-16 NOTE — Patient Instructions (Signed)
I had a long discussion with the patient and her daughter regarding her recent admission for hypertensive urgency and abnormal MRI scan of the brain showing tiny brainstem abnormality which may be an artifact rather than stroke as  her clinical symptoms did not match that MRI abnormality. Continue warfarin for stroke prevention given history of DVT and pulmonary embolism. Strict control of hypertension with blood pressure goal below 140/90. I encouraged her to use a cane or a walker at all times and we also discussed fall and safety precautions. No need for a scheduled follow-up appointment but she may be referred back in the future as necessary   Fall Prevention in the [place] can cause injuries. They can happen to people of all ages. There are many things you can do to make your home safe and to help prevent falls. What can I do on the outside of my home?  Regularly fix the edges of walkways and driveways and fix any cracks.  Remove anything that might make you trip as you walk through a door, such as a raised step or threshold.  Trim any bushes or trees on the path to your home.  Use bright outdoor lighting.  Clear any walking paths of anything that might make someone trip, such as rocks or tools.  Regularly check to see if handrails are loose or broken. Make sure that both sides of any steps have handrails.  Any raised decks and porches should have guardrails on the edges.  Have any leaves, snow, or ice cleared regularly.  Use sand or salt on walking paths during winter.  Clean up any spills in your garage right away. This includes oil or grease spills. What can I do in the bathroom?  Use night lights.  Install grab bars by the toilet and in the tub and shower. Do not use towel bars as grab bars.  Use non-skid mats or decals in the tub or shower.  If you need to sit down in the shower, use a plastic, non-slip stool.  Keep the floor dry. Clean up any water that spills on the  floor as soon as it happens.  Remove soap buildup in the tub or shower regularly.  Attach bath mats securely with double-sided non-slip rug tape.  Do not have throw rugs and other things on the floor that can make you trip. What can I do in the bedroom?  Use night lights.  Make sure that you have a light by your bed that is easy to reach.  Do not use any sheets or blankets that are too big for your bed. They should not hang down onto the floor.  Have a firm chair that has side arms. You can use this for support while you get dressed.  Do not have throw rugs and other things on the floor that can make you trip. What can I do in the kitchen?  Clean up any spills right away.  Avoid walking on wet floors.  Keep items that you use a lot in easy-to-reach places.  If you need to reach something above you, use a strong step stool that has a grab bar.  Keep electrical cords out of the way.  Do not use floor polish or wax that makes floors slippery. If you must use wax, use non-skid floor wax.  Do not have throw rugs and other things on the floor that can make you trip. What can I do with my stairs?  Do not leave any  items on the stairs.  Make sure that there are handrails on both sides of the stairs and use them. Fix handrails that are broken or loose. Make sure that handrails are as long as the stairways.  Check any carpeting to make sure that it is firmly attached to the stairs. Fix any carpet that is loose or worn.  Avoid having throw rugs at the top or bottom of the stairs. If you do have throw rugs, attach them to the floor with carpet tape.  Make sure that you have a light switch at the top of the stairs and the bottom of the stairs. If you do not have them, ask someone to add them for you. What else can I do to help prevent falls?  Wear shoes that: ? Do not have high heels. ? Have rubber bottoms. ? Are comfortable and fit you well. ? Are closed at the toe. Do not wear  sandals.  If you use a stepladder: ? Make sure that it is fully opened. Do not climb a closed stepladder. ? Make sure that both sides of the stepladder are locked into place. ? Ask someone to hold it for you, if possible.  Clearly mark and make sure that you can see: ? Any grab bars or handrails. ? First and last steps. ? Where the edge of each step is.  Use tools that help you move around (mobility aids) if they are needed. These include: ? Canes. ? Walkers. ? Scooters. ? Crutches.  Turn on the lights when you go into a dark area. Replace any light bulbs as soon as they burn out.  Set up your furniture so you have a clear path. Avoid moving your furniture around.  If any of your floors are uneven, fix them.  If there are any pets around you, be aware of where they are.  Review your medicines with your doctor. Some medicines can make you feel dizzy. This can increase your chance of falling. Ask your doctor what other things that you can do to help prevent falls. This information is not intended to replace advice given to you by your health care provider. Make sure you discuss any questions you have with your health care provider. Document Released: 06/19/2009 Document Revised: 01/29/2016 Document Reviewed: 09/27/2014 Elsevier Interactive Patient Education  [employer].

## 2018-05-24 DIAGNOSIS — I129 Hypertensive chronic kidney disease with stage 1 through stage 4 chronic kidney disease, or unspecified chronic kidney disease: Secondary | ICD-10-CM | POA: Diagnosis not present

## 2018-05-24 DIAGNOSIS — E7800 Pure hypercholesterolemia, unspecified: Secondary | ICD-10-CM | POA: Diagnosis not present

## 2018-05-24 DIAGNOSIS — E785 Hyperlipidemia, unspecified: Secondary | ICD-10-CM | POA: Diagnosis not present

## 2018-05-24 DIAGNOSIS — N390 Urinary tract infection, site not specified: Secondary | ICD-10-CM | POA: Diagnosis not present

## 2018-05-24 DIAGNOSIS — E538 Deficiency of other specified B group vitamins: Secondary | ICD-10-CM | POA: Diagnosis not present

## 2018-05-24 DIAGNOSIS — Z7901 Long term (current) use of anticoagulants: Secondary | ICD-10-CM | POA: Diagnosis not present

## 2018-05-24 DIAGNOSIS — N184 Chronic kidney disease, stage 4 (severe): Secondary | ICD-10-CM | POA: Diagnosis not present

## 2018-05-24 DIAGNOSIS — I10 Essential (primary) hypertension: Secondary | ICD-10-CM | POA: Diagnosis not present

## 2018-05-31 DIAGNOSIS — G459 Transient cerebral ischemic attack, unspecified: Secondary | ICD-10-CM | POA: Diagnosis not present

## 2018-05-31 DIAGNOSIS — N184 Chronic kidney disease, stage 4 (severe): Secondary | ICD-10-CM | POA: Diagnosis not present

## 2018-05-31 DIAGNOSIS — Z23 Encounter for immunization: Secondary | ICD-10-CM | POA: Diagnosis not present

## 2018-05-31 DIAGNOSIS — E785 Hyperlipidemia, unspecified: Secondary | ICD-10-CM | POA: Diagnosis not present

## 2018-05-31 DIAGNOSIS — I129 Hypertensive chronic kidney disease with stage 1 through stage 4 chronic kidney disease, or unspecified chronic kidney disease: Secondary | ICD-10-CM | POA: Diagnosis not present

## 2018-05-31 DIAGNOSIS — Z0000 Encounter for general adult medical examination without abnormal findings: Secondary | ICD-10-CM | POA: Diagnosis not present

## 2018-05-31 DIAGNOSIS — Z86711 Personal history of pulmonary embolism: Secondary | ICD-10-CM | POA: Diagnosis not present

## 2018-05-31 DIAGNOSIS — M810 Age-related osteoporosis without current pathological fracture: Secondary | ICD-10-CM | POA: Diagnosis not present

## 2018-06-22 DIAGNOSIS — Z86711 Personal history of pulmonary embolism: Secondary | ICD-10-CM | POA: Diagnosis not present

## 2018-06-22 DIAGNOSIS — Z7901 Long term (current) use of anticoagulants: Secondary | ICD-10-CM | POA: Diagnosis not present

## 2018-07-20 DIAGNOSIS — Z86711 Personal history of pulmonary embolism: Secondary | ICD-10-CM | POA: Diagnosis not present

## 2018-07-20 DIAGNOSIS — Z7901 Long term (current) use of anticoagulants: Secondary | ICD-10-CM | POA: Diagnosis not present

## 2018-08-11 DIAGNOSIS — L84 Corns and callosities: Secondary | ICD-10-CM | POA: Diagnosis not present

## 2018-08-11 DIAGNOSIS — I739 Peripheral vascular disease, unspecified: Secondary | ICD-10-CM | POA: Diagnosis not present

## 2018-08-11 DIAGNOSIS — L603 Nail dystrophy: Secondary | ICD-10-CM | POA: Diagnosis not present

## 2018-08-17 DIAGNOSIS — Z7901 Long term (current) use of anticoagulants: Secondary | ICD-10-CM | POA: Diagnosis not present

## 2018-08-17 DIAGNOSIS — Z86711 Personal history of pulmonary embolism: Secondary | ICD-10-CM | POA: Diagnosis not present

## 2018-09-21 DIAGNOSIS — Z86711 Personal history of pulmonary embolism: Secondary | ICD-10-CM | POA: Diagnosis not present

## 2018-09-21 DIAGNOSIS — Z7901 Long term (current) use of anticoagulants: Secondary | ICD-10-CM | POA: Diagnosis not present

## 2018-10-19 DIAGNOSIS — Z86711 Personal history of pulmonary embolism: Secondary | ICD-10-CM | POA: Diagnosis not present

## 2018-10-19 DIAGNOSIS — Z7901 Long term (current) use of anticoagulants: Secondary | ICD-10-CM | POA: Diagnosis not present

## 2018-11-13 DIAGNOSIS — I739 Peripheral vascular disease, unspecified: Secondary | ICD-10-CM | POA: Diagnosis not present

## 2018-11-13 DIAGNOSIS — L84 Corns and callosities: Secondary | ICD-10-CM | POA: Diagnosis not present

## 2018-11-13 DIAGNOSIS — L603 Nail dystrophy: Secondary | ICD-10-CM | POA: Diagnosis not present

## 2018-11-16 DIAGNOSIS — Z7901 Long term (current) use of anticoagulants: Secondary | ICD-10-CM | POA: Diagnosis not present

## 2018-11-16 DIAGNOSIS — Z86711 Personal history of pulmonary embolism: Secondary | ICD-10-CM | POA: Diagnosis not present

## 2018-12-27 DIAGNOSIS — Z7901 Long term (current) use of anticoagulants: Secondary | ICD-10-CM | POA: Diagnosis not present

## 2018-12-27 DIAGNOSIS — Z86711 Personal history of pulmonary embolism: Secondary | ICD-10-CM | POA: Diagnosis not present

## 2019-01-17 DIAGNOSIS — N184 Chronic kidney disease, stage 4 (severe): Secondary | ICD-10-CM | POA: Diagnosis not present

## 2019-01-17 DIAGNOSIS — E785 Hyperlipidemia, unspecified: Secondary | ICD-10-CM | POA: Diagnosis not present

## 2019-01-17 DIAGNOSIS — I129 Hypertensive chronic kidney disease with stage 1 through stage 4 chronic kidney disease, or unspecified chronic kidney disease: Secondary | ICD-10-CM | POA: Diagnosis not present

## 2019-01-24 DIAGNOSIS — I129 Hypertensive chronic kidney disease with stage 1 through stage 4 chronic kidney disease, or unspecified chronic kidney disease: Secondary | ICD-10-CM | POA: Diagnosis not present

## 2019-01-24 DIAGNOSIS — N184 Chronic kidney disease, stage 4 (severe): Secondary | ICD-10-CM | POA: Diagnosis not present

## 2019-01-24 DIAGNOSIS — Z86711 Personal history of pulmonary embolism: Secondary | ICD-10-CM | POA: Diagnosis not present

## 2019-01-24 DIAGNOSIS — E785 Hyperlipidemia, unspecified: Secondary | ICD-10-CM | POA: Diagnosis not present

## 2019-01-24 DIAGNOSIS — Z7189 Other specified counseling: Secondary | ICD-10-CM | POA: Diagnosis not present

## 2019-01-31 DIAGNOSIS — Z7901 Long term (current) use of anticoagulants: Secondary | ICD-10-CM | POA: Diagnosis not present

## 2019-01-31 DIAGNOSIS — Z86711 Personal history of pulmonary embolism: Secondary | ICD-10-CM | POA: Diagnosis not present

## 2019-03-01 DIAGNOSIS — Z7901 Long term (current) use of anticoagulants: Secondary | ICD-10-CM | POA: Diagnosis not present

## 2019-03-01 DIAGNOSIS — Z86711 Personal history of pulmonary embolism: Secondary | ICD-10-CM | POA: Diagnosis not present

## 2019-04-05 DIAGNOSIS — Z86711 Personal history of pulmonary embolism: Secondary | ICD-10-CM | POA: Diagnosis not present

## 2019-04-05 DIAGNOSIS — Z7901 Long term (current) use of anticoagulants: Secondary | ICD-10-CM | POA: Diagnosis not present

## 2019-05-03 DIAGNOSIS — Z7901 Long term (current) use of anticoagulants: Secondary | ICD-10-CM | POA: Diagnosis not present

## 2019-05-03 DIAGNOSIS — Z86711 Personal history of pulmonary embolism: Secondary | ICD-10-CM | POA: Diagnosis not present

## 2019-05-18 DIAGNOSIS — Z23 Encounter for immunization: Secondary | ICD-10-CM | POA: Diagnosis not present

## 2019-05-30 DIAGNOSIS — Z86711 Personal history of pulmonary embolism: Secondary | ICD-10-CM | POA: Diagnosis not present

## 2019-05-30 DIAGNOSIS — Z7901 Long term (current) use of anticoagulants: Secondary | ICD-10-CM | POA: Diagnosis not present

## 2019-06-18 DIAGNOSIS — I739 Peripheral vascular disease, unspecified: Secondary | ICD-10-CM | POA: Diagnosis not present

## 2019-06-18 DIAGNOSIS — L603 Nail dystrophy: Secondary | ICD-10-CM | POA: Diagnosis not present

## 2019-06-18 DIAGNOSIS — L84 Corns and callosities: Secondary | ICD-10-CM | POA: Diagnosis not present

## 2019-07-04 DIAGNOSIS — Z7901 Long term (current) use of anticoagulants: Secondary | ICD-10-CM | POA: Diagnosis not present

## 2019-07-04 DIAGNOSIS — Z86711 Personal history of pulmonary embolism: Secondary | ICD-10-CM | POA: Diagnosis not present

## 2019-07-12 DIAGNOSIS — I129 Hypertensive chronic kidney disease with stage 1 through stage 4 chronic kidney disease, or unspecified chronic kidney disease: Secondary | ICD-10-CM | POA: Diagnosis not present

## 2019-07-12 DIAGNOSIS — Z7189 Other specified counseling: Secondary | ICD-10-CM | POA: Diagnosis not present

## 2019-07-12 DIAGNOSIS — Z0000 Encounter for general adult medical examination without abnormal findings: Secondary | ICD-10-CM | POA: Diagnosis not present

## 2019-07-12 DIAGNOSIS — Z86711 Personal history of pulmonary embolism: Secondary | ICD-10-CM | POA: Diagnosis not present

## 2019-07-12 DIAGNOSIS — N184 Chronic kidney disease, stage 4 (severe): Secondary | ICD-10-CM | POA: Diagnosis not present

## 2019-07-12 DIAGNOSIS — E785 Hyperlipidemia, unspecified: Secondary | ICD-10-CM | POA: Diagnosis not present

## 2019-08-01 DIAGNOSIS — Z7901 Long term (current) use of anticoagulants: Secondary | ICD-10-CM | POA: Diagnosis not present

## 2019-08-01 DIAGNOSIS — Z86711 Personal history of pulmonary embolism: Secondary | ICD-10-CM | POA: Diagnosis not present

## 2019-08-06 DIAGNOSIS — G459 Transient cerebral ischemic attack, unspecified: Secondary | ICD-10-CM | POA: Diagnosis not present

## 2019-08-06 DIAGNOSIS — Z86711 Personal history of pulmonary embolism: Secondary | ICD-10-CM | POA: Diagnosis not present

## 2019-08-06 DIAGNOSIS — I129 Hypertensive chronic kidney disease with stage 1 through stage 4 chronic kidney disease, or unspecified chronic kidney disease: Secondary | ICD-10-CM | POA: Diagnosis not present

## 2019-08-06 DIAGNOSIS — N184 Chronic kidney disease, stage 4 (severe): Secondary | ICD-10-CM | POA: Diagnosis not present

## 2019-08-06 DIAGNOSIS — E785 Hyperlipidemia, unspecified: Secondary | ICD-10-CM | POA: Diagnosis not present

## 2019-08-06 DIAGNOSIS — R413 Other amnesia: Secondary | ICD-10-CM | POA: Diagnosis not present

## 2019-09-26 DIAGNOSIS — Z7901 Long term (current) use of anticoagulants: Secondary | ICD-10-CM | POA: Diagnosis not present

## 2019-09-26 DIAGNOSIS — Z86711 Personal history of pulmonary embolism: Secondary | ICD-10-CM | POA: Diagnosis not present

## 2019-09-26 DIAGNOSIS — M810 Age-related osteoporosis without current pathological fracture: Secondary | ICD-10-CM | POA: Diagnosis not present

## 2019-10-24 DIAGNOSIS — Z86711 Personal history of pulmonary embolism: Secondary | ICD-10-CM | POA: Diagnosis not present

## 2019-10-24 DIAGNOSIS — M810 Age-related osteoporosis without current pathological fracture: Secondary | ICD-10-CM | POA: Diagnosis not present

## 2019-10-24 DIAGNOSIS — Z7901 Long term (current) use of anticoagulants: Secondary | ICD-10-CM | POA: Diagnosis not present

## 2019-11-05 DIAGNOSIS — Z7901 Long term (current) use of anticoagulants: Secondary | ICD-10-CM | POA: Diagnosis not present

## 2019-11-16 ENCOUNTER — Ambulatory Visit: Payer: Medicare Other | Attending: Internal Medicine

## 2019-11-16 DIAGNOSIS — Z23 Encounter for immunization: Secondary | ICD-10-CM

## 2019-11-16 NOTE — Progress Notes (Signed)
   Covid-19 Vaccination Clinic  Name:  Allison Rhodes    MRN: [id number] DOB: 12-25-1921  11/16/2019  Allison Rhodes was observed post Covid-19 immunization for 15 minutes without incident. She was provided with Vaccine Information Sheet and instruction to access the V-Safe system.   Allison Rhodes was instructed to call 911 with any severe reactions post vaccine: Marland Kitchen Difficulty breathing  . Swelling of face and throat  . A fast heartbeat  . A bad rash all over body  . Dizziness and weakness   Immunizations Administered    Name Date Dose VIS Date Route   Pfizer COVID-19 Vaccine 11/16/2019  1:41 PM 0.3 mL 08/17/2019 Intramuscular   Manufacturer: [employer]   Lot: [id number]   [place]: [address]-2

## 2019-11-21 DIAGNOSIS — Z86711 Personal history of pulmonary embolism: Secondary | ICD-10-CM | POA: Diagnosis not present

## 2019-11-21 DIAGNOSIS — Z7901 Long term (current) use of anticoagulants: Secondary | ICD-10-CM | POA: Diagnosis not present

## 2019-12-10 ENCOUNTER — Ambulatory Visit: Payer: Medicare Other | Attending: Internal Medicine

## 2019-12-10 DIAGNOSIS — Z23 Encounter for immunization: Secondary | ICD-10-CM

## 2019-12-10 NOTE — Progress Notes (Signed)
   Covid-19 Vaccination Clinic  Name:  Allison Rhodes    MRN: [id number] DOB: [birth year]  12/10/2019  [name] was observed post Covid-19 immunization for 15 minutes without incident. She was provided with Vaccine Information Sheet and instruction to access the V-Safe system.   [name] was instructed to call 911 with any severe reactions post vaccine: Marland Kitchen Difficulty breathing  . Swelling of face and throat  . A fast heartbeat  . A bad rash all over body  . Dizziness and weakness   Immunizations Administered    Name Date Dose VIS Date Route   Pfizer COVID-19 Vaccine 12/10/2019  3:20 PM 0.3 mL 08/17/2019 Intramuscular   Manufacturer: [employer]   Lot: [id number]   [place]: [address]-3

## 2019-12-13 DIAGNOSIS — Z7901 Long term (current) use of anticoagulants: Secondary | ICD-10-CM | POA: Diagnosis not present

## 2019-12-13 DIAGNOSIS — Z86711 Personal history of pulmonary embolism: Secondary | ICD-10-CM | POA: Diagnosis not present

## 2019-12-19 DIAGNOSIS — I739 Peripheral vascular disease, unspecified: Secondary | ICD-10-CM | POA: Diagnosis not present

## 2019-12-19 DIAGNOSIS — L603 Nail dystrophy: Secondary | ICD-10-CM | POA: Diagnosis not present

## 2019-12-19 DIAGNOSIS — L84 Corns and callosities: Secondary | ICD-10-CM | POA: Diagnosis not present

## 2020-01-10 DIAGNOSIS — Z86711 Personal history of pulmonary embolism: Secondary | ICD-10-CM | POA: Diagnosis not present

## 2020-01-10 DIAGNOSIS — Z7901 Long term (current) use of anticoagulants: Secondary | ICD-10-CM | POA: Diagnosis not present

## 2020-01-19 IMAGING — MR MR HEAD W/O CM
9 of 10 series · 36 of 48 positions shown · non-contrast
Comparison: Head CT without contrast 7112 hours today.

CLINICAL DATA: [AGE] female with altered mental status. Woke
up confused. Leaning to the right side. History of intermittent
atrial fibrillation on Coumadin.

EXAM:
MRI HEAD WITHOUT CONTRAST
TECHNIQUE: Multiplanar, multiecho pulse sequences of the brain and surrounding
structures were obtained without intravenous contrast.

[Series 3: DWI · axial · 3.0mm · 0.94mm/px · z∈[-31,+109]mm · 8 of 96 slices shown (1 of 2)]
[im 1/96]
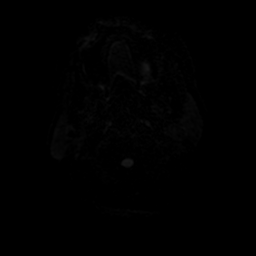
[im 11/96]
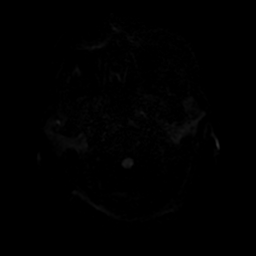
[im 32/96]
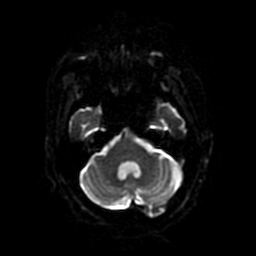
[im 43/96]
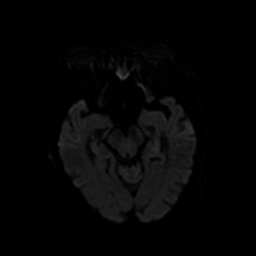
[im 53/96]
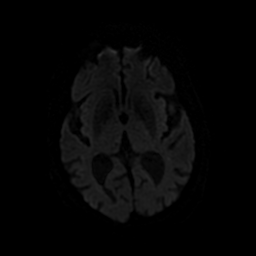
[im 64/96]
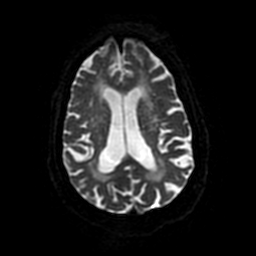
[im 85/96]
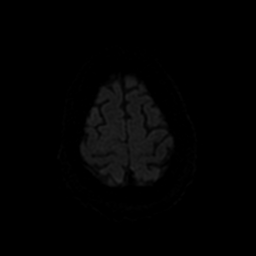
[im 96/96]
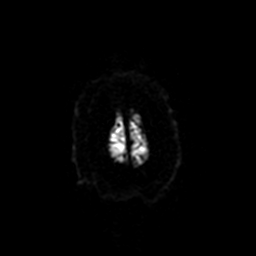

[Series 4: FLAIR · axial · 3.0mm · 0.94mm/px · z∈[-30,+108]mm · 2 of 24 slices shown (1 of 2)]
[im 1/24]
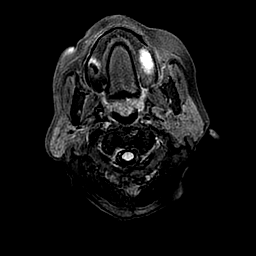
[im 24/24]
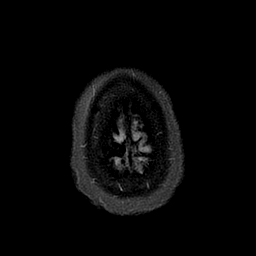

[Series 5: (person_name) · axial · 3.0mm · 0.47mm/px · z∈[-31,+21]mm · 4 of 96 slices shown]
[im 1/96]
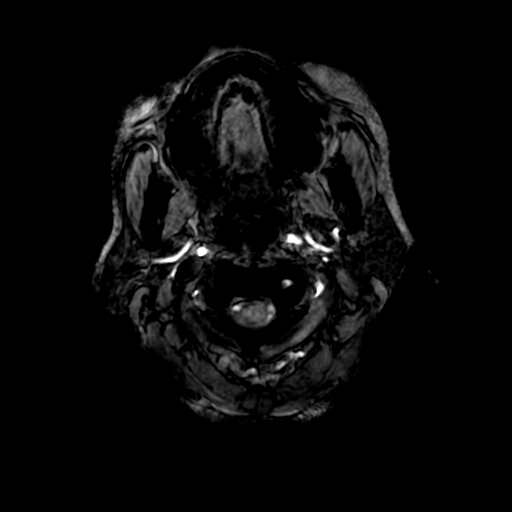
[im 12/96]
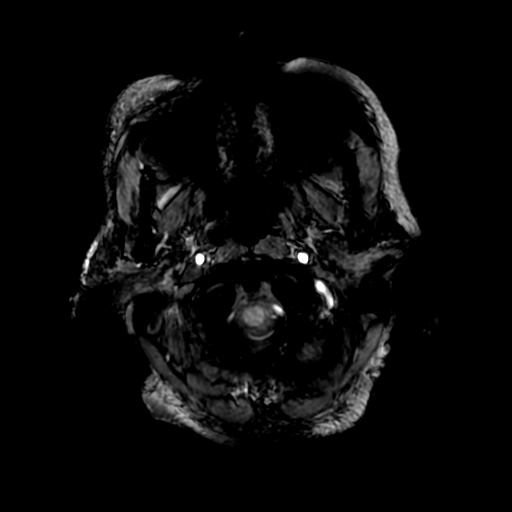
[im 24/96]
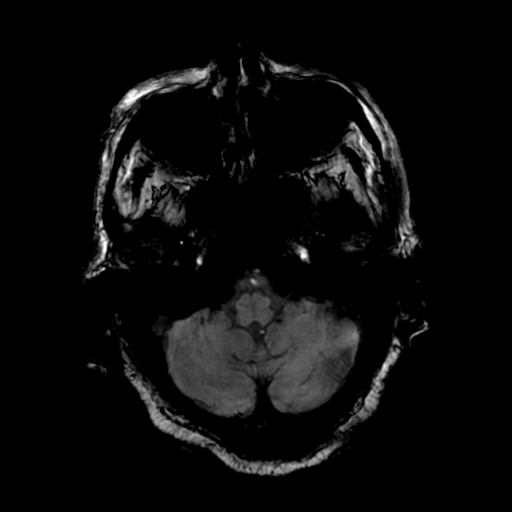
[im 36/96]
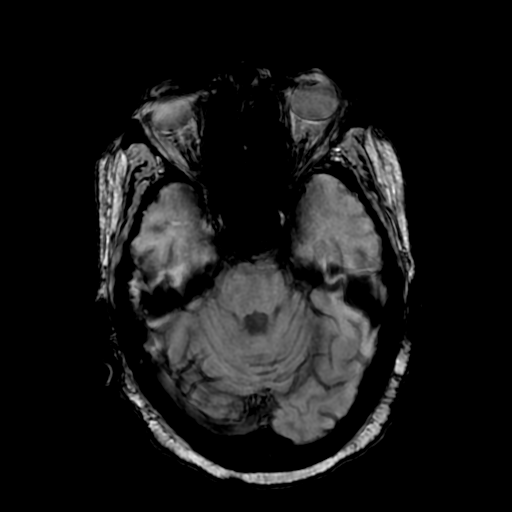

[Series 6: T2 · axial · 5.0mm · 0.47mm/px · z∈[-30,+108]mm · 2 of 24 slices shown (1 of 2)]
[im 1/24]
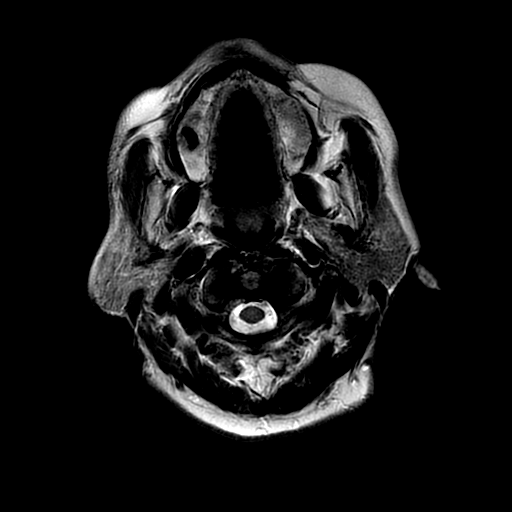
[im 24/24]
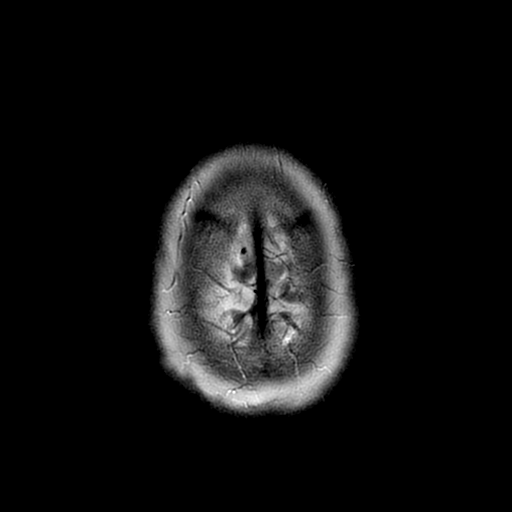

[Series 7: DWI · coronal · 4.0mm · 0.94mm/px · 7 of 70 slices shown (2 of 2)]
[im 1/70]
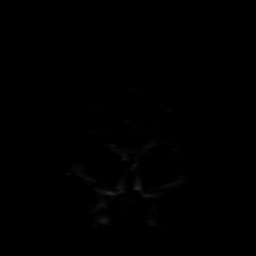
[im 12/70]
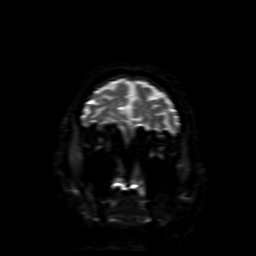
[im 24/70]
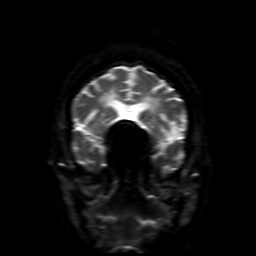
[im 35/70]
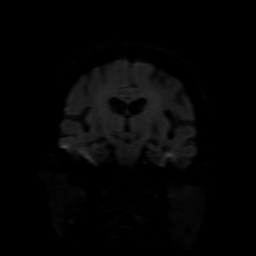
[im 47/70]
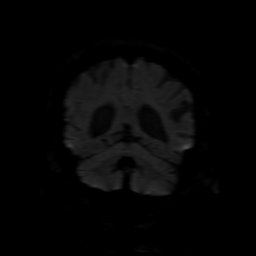
[im 58/70]
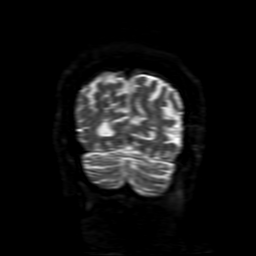
[im 70/70]
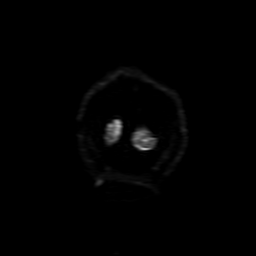

[Series 8: FLAIR · sagittal · 5.0mm · 0.47mm/px · 2 of 23 slices shown (2 of 2)]
[im 1/23]
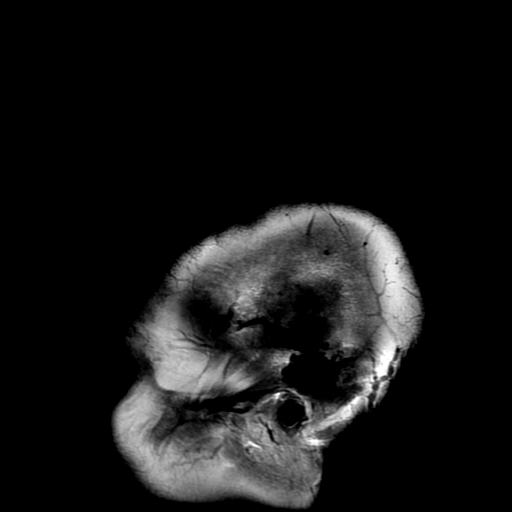
[im 23/23]
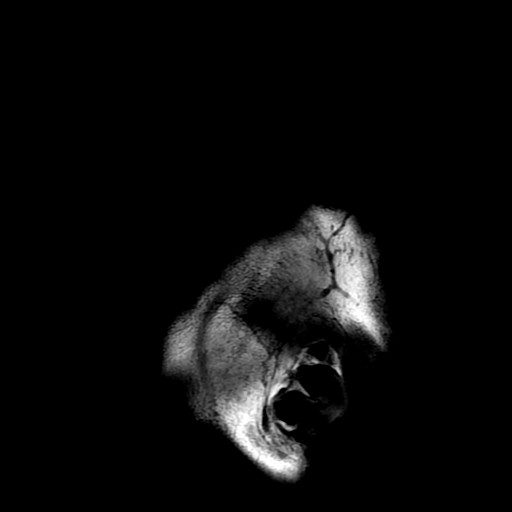

[Series 10: T2 · coronal · 5.0mm · 0.43mm/px · 3 of 29 slices shown (2 of 2)]
[im 1/29]
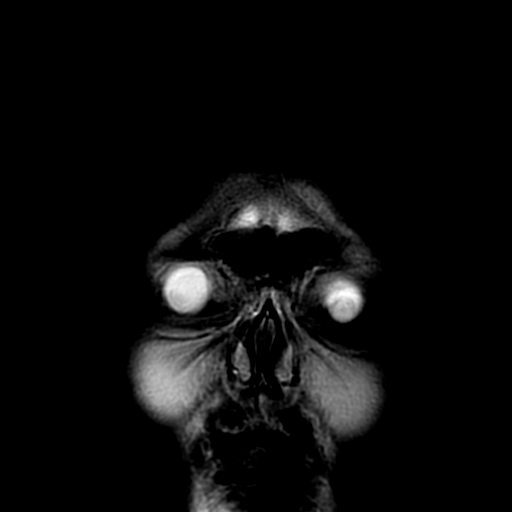
[im 15/29]
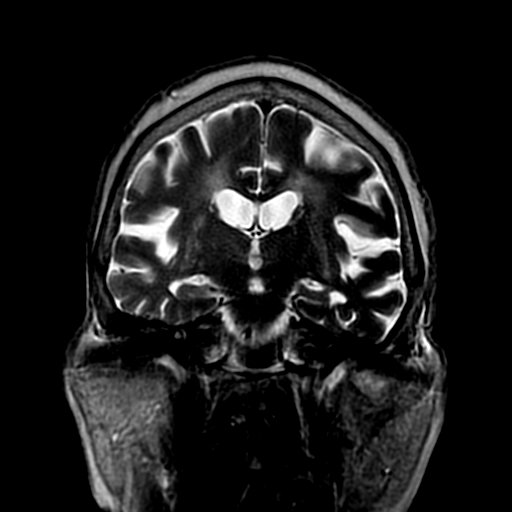
[im 29/29]
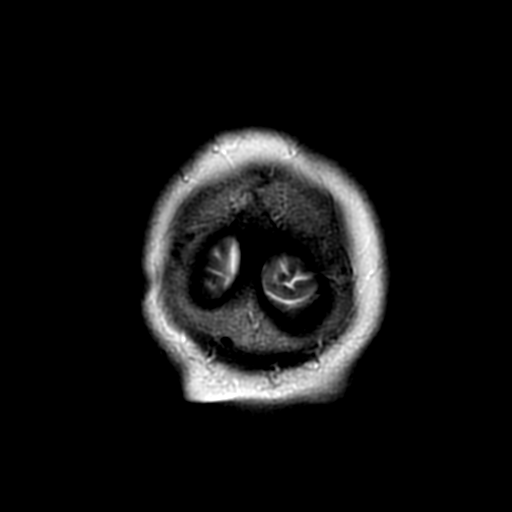

[Series 350: ADC · axial · 3.0mm · 0.94mm/px · z∈[-31,+109]mm · 5 of 48 slices shown (1 of 2)]
[im 1/48]
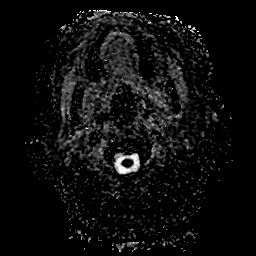
[im 12/48]
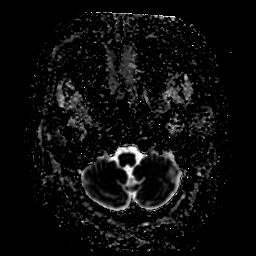
[im 24/48]
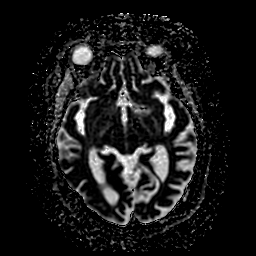
[im 36/48]
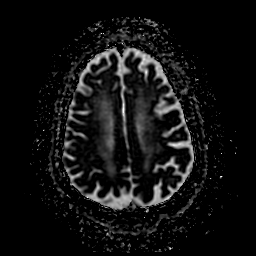
[im 48/48]
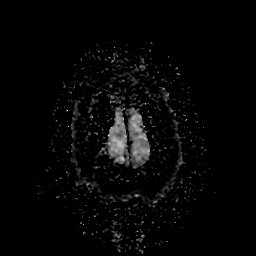

[Series 750: ADC · coronal · 4.0mm · 0.94mm/px · 3 of 35 slices shown (2 of 2)]
[im 1/35]
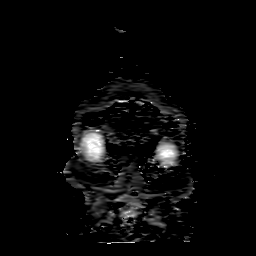
[im 18/35]
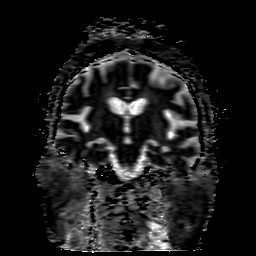
[im 35/35]
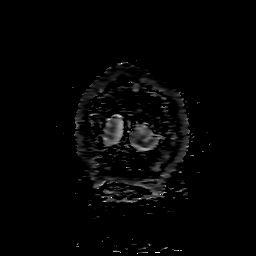

[36 of 48 positions shown; findings below may reference images not displayed]

FINDINGS: Brain: Questionable punctate area of restricted diffusion in the
dorsal left pons (series 3, image 19), although no associated T2 or
FLAIR hyperintensity. Chronic micro hemorrhages scattered throughout
the deep gray matter nuclei, most pronounced in the right thalamus.
Occasional hemispheric microhemorrhage. Occasional tiny chronic
lacune in the left cerebellum. Patchy and confluent bilateral
cerebral white matter T2 and FLAIR hyperintensity. No cortical
encephalomalacia.

No other restricted diffusion. No midline shift, mass effect,
evidence of mass lesion, ventriculomegaly, extra-axial collection or
acute intracranial hemorrhage. Cervicomedullary junction and
pituitary are within normal limits.

Vascular: Major intracranial vascular flow voids are preserved.

Skull and upper cervical spine: Negative visible cervical spine.
Normal bone marrow signal.

Sinuses/Orbits: Postoperative changes to both globes. Paranasal
sinuses are clear.

Other: Mastoids are clear. Visible internal auditory structures
appear normal. Scalp and face soft tissues appear negative.
IMPRESSION: 1. Possible tiny acute lacunar infarct in the dorsal left pons
(series 3, image 19), although this could be image noise/artifact.
2. No other acute intracranial abnormality.
3. Chronic small vessel disease, particularly chronic
micro-hemorrhages in the deep gray matter nuclei.

## 2020-01-26 ENCOUNTER — Other Ambulatory Visit: Payer: Self-pay

## 2020-01-26 ENCOUNTER — Emergency Department (HOSPITAL_COMMUNITY): Payer: Medicare Other

## 2020-01-26 ENCOUNTER — Inpatient Hospital Stay (HOSPITAL_COMMUNITY)
Admission: EM | Admit: 2020-01-26 | Discharge: 2020-01-29 | DRG: 065 | Disposition: A | Discharge status: Home Health | Payer: Medicare Other | Attending: Internal Medicine | Admitting: Internal Medicine

## 2020-01-26 ENCOUNTER — Encounter (HOSPITAL_COMMUNITY): Payer: Self-pay

## 2020-01-26 DIAGNOSIS — R9401 Abnormal electroencephalogram [EEG]: Secondary | ICD-10-CM | POA: Diagnosis not present

## 2020-01-26 DIAGNOSIS — H9190 Unspecified hearing loss, unspecified ear: Secondary | ICD-10-CM | POA: Diagnosis present

## 2020-01-26 DIAGNOSIS — N1832 Chronic kidney disease, stage 3b: Secondary | ICD-10-CM | POA: Diagnosis present

## 2020-01-26 DIAGNOSIS — I4821 Permanent atrial fibrillation: Secondary | ICD-10-CM | POA: Diagnosis present

## 2020-01-26 DIAGNOSIS — G92 Toxic encephalopathy: Secondary | ICD-10-CM

## 2020-01-26 DIAGNOSIS — R809 Proteinuria, unspecified: Secondary | ICD-10-CM | POA: Diagnosis present

## 2020-01-26 DIAGNOSIS — I160 Hypertensive urgency: Secondary | ICD-10-CM | POA: Diagnosis present

## 2020-01-26 DIAGNOSIS — Z86711 Personal history of pulmonary embolism: Secondary | ICD-10-CM | POA: Diagnosis present

## 2020-01-26 DIAGNOSIS — R471 Dysarthria and anarthria: Secondary | ICD-10-CM | POA: Diagnosis not present

## 2020-01-26 DIAGNOSIS — I739 Peripheral vascular disease, unspecified: Secondary | ICD-10-CM | POA: Diagnosis present

## 2020-01-26 DIAGNOSIS — R531 Weakness: Secondary | ICD-10-CM | POA: Diagnosis present

## 2020-01-26 DIAGNOSIS — Z9842 Cataract extraction status, left eye: Secondary | ICD-10-CM | POA: Diagnosis not present

## 2020-01-26 DIAGNOSIS — Z8673 Personal history of transient ischemic attack (TIA), and cerebral infarction without residual deficits: Secondary | ICD-10-CM | POA: Diagnosis not present

## 2020-01-26 DIAGNOSIS — Z7901 Long term (current) use of anticoagulants: Secondary | ICD-10-CM

## 2020-01-26 DIAGNOSIS — I639 Cerebral infarction, unspecified: Secondary | ICD-10-CM

## 2020-01-26 DIAGNOSIS — N184 Chronic kidney disease, stage 4 (severe): Secondary | ICD-10-CM | POA: Diagnosis not present

## 2020-01-26 DIAGNOSIS — Z86718 Personal history of other venous thrombosis and embolism: Secondary | ICD-10-CM

## 2020-01-26 DIAGNOSIS — Z79899 Other long term (current) drug therapy: Secondary | ICD-10-CM | POA: Diagnosis not present

## 2020-01-26 DIAGNOSIS — I482 Chronic atrial fibrillation, unspecified: Secondary | ICD-10-CM | POA: Diagnosis not present

## 2020-01-26 DIAGNOSIS — R0789 Other chest pain: Secondary | ICD-10-CM | POA: Diagnosis not present

## 2020-01-26 DIAGNOSIS — R000 Tachycardia, unspecified: Secondary | ICD-10-CM | POA: Diagnosis present

## 2020-01-26 DIAGNOSIS — J9811 Atelectasis: Secondary | ICD-10-CM | POA: Diagnosis present

## 2020-01-26 DIAGNOSIS — I361 Nonrheumatic tricuspid (valve) insufficiency: Secondary | ICD-10-CM | POA: Diagnosis not present

## 2020-01-26 DIAGNOSIS — I499 Cardiac arrhythmia, unspecified: Secondary | ICD-10-CM | POA: Diagnosis not present

## 2020-01-26 DIAGNOSIS — R079 Chest pain, unspecified: Secondary | ICD-10-CM | POA: Diagnosis not present

## 2020-01-26 DIAGNOSIS — I6381 Other cerebral infarction due to occlusion or stenosis of small artery: Secondary | ICD-10-CM | POA: Diagnosis present

## 2020-01-26 DIAGNOSIS — R4182 Altered mental status, unspecified: Secondary | ICD-10-CM | POA: Diagnosis not present

## 2020-01-26 DIAGNOSIS — R262 Difficulty in walking, not elsewhere classified: Secondary | ICD-10-CM | POA: Diagnosis present

## 2020-01-26 DIAGNOSIS — I6389 Other cerebral infarction: Secondary | ICD-10-CM | POA: Diagnosis not present

## 2020-01-26 DIAGNOSIS — Z961 Presence of intraocular lens: Secondary | ICD-10-CM | POA: Diagnosis present

## 2020-01-26 DIAGNOSIS — R29704 NIHSS score 4: Secondary | ICD-10-CM | POA: Diagnosis present

## 2020-01-26 DIAGNOSIS — I129 Hypertensive chronic kidney disease with stage 1 through stage 4 chronic kidney disease, or unspecified chronic kidney disease: Secondary | ICD-10-CM | POA: Diagnosis present

## 2020-01-26 DIAGNOSIS — Z20822 Contact with and (suspected) exposure to covid-19: Secondary | ICD-10-CM | POA: Diagnosis present

## 2020-01-26 DIAGNOSIS — I10 Essential (primary) hypertension: Secondary | ICD-10-CM | POA: Diagnosis present

## 2020-01-26 DIAGNOSIS — E785 Hyperlipidemia, unspecified: Secondary | ICD-10-CM | POA: Diagnosis present

## 2020-01-26 DIAGNOSIS — Z9841 Cataract extraction status, right eye: Secondary | ICD-10-CM

## 2020-01-26 DIAGNOSIS — G8929 Other chronic pain: Secondary | ICD-10-CM | POA: Diagnosis present

## 2020-01-26 DIAGNOSIS — R29810 Facial weakness: Secondary | ICD-10-CM | POA: Diagnosis not present

## 2020-01-26 DIAGNOSIS — R29818 Other symptoms and signs involving the nervous system: Secondary | ICD-10-CM | POA: Diagnosis not present

## 2020-01-26 DIAGNOSIS — I63512 Cerebral infarction due to unspecified occlusion or stenosis of left middle cerebral artery: Principal | ICD-10-CM | POA: Diagnosis present

## 2020-01-26 DIAGNOSIS — N189 Chronic kidney disease, unspecified: Secondary | ICD-10-CM | POA: Diagnosis present

## 2020-01-26 DIAGNOSIS — R2990 Unspecified symptoms and signs involving the nervous system: Secondary | ICD-10-CM

## 2020-01-26 DIAGNOSIS — I480 Paroxysmal atrial fibrillation: Secondary | ICD-10-CM | POA: Diagnosis not present

## 2020-01-26 DIAGNOSIS — I071 Rheumatic tricuspid insufficiency: Secondary | ICD-10-CM | POA: Diagnosis present

## 2020-01-26 DIAGNOSIS — R791 Abnormal coagulation profile: Secondary | ICD-10-CM | POA: Diagnosis present

## 2020-01-26 DIAGNOSIS — E7800 Pure hypercholesterolemia, unspecified: Secondary | ICD-10-CM | POA: Diagnosis not present

## 2020-01-26 DIAGNOSIS — M810 Age-related osteoporosis without current pathological fracture: Secondary | ICD-10-CM | POA: Diagnosis present

## 2020-01-26 DIAGNOSIS — R0902 Hypoxemia: Secondary | ICD-10-CM | POA: Diagnosis not present

## 2020-01-26 DIAGNOSIS — R9082 White matter disease, unspecified: Secondary | ICD-10-CM | POA: Diagnosis present

## 2020-01-26 DIAGNOSIS — R413 Other amnesia: Secondary | ICD-10-CM | POA: Diagnosis present

## 2020-01-26 LAB — I-STAT CHEM 8, ED
BUN: 32 mg/dL — ABNORMAL HIGH (ref 8–23)
Calcium, Ion: 1.18 mmol/L (ref 1.15–1.40)
Chloride: 107 mmol/L (ref 98–111)
Creatinine, Ser: 1.9 mg/dL — ABNORMAL HIGH (ref 0.44–1.00)
Glucose, Bld: 89 mg/dL (ref 70–99)
HCT: 41 % (ref 36.0–46.0)
Hemoglobin: 13.9 g/dL (ref 12.0–15.0)
Potassium: 4.1 mmol/L (ref 3.5–5.1)
Sodium: 143 mmol/L (ref 135–145)
TCO2: 29 mmol/L (ref 22–32)

## 2020-01-26 LAB — CBC
HCT: 41.3 % (ref 36.0–46.0)
Hemoglobin: 13.2 g/dL (ref 12.0–15.0)
MCH: 34.2 pg — ABNORMAL HIGH (ref 26.0–34.0)
MCHC: 32 g/dL (ref 30.0–36.0)
MCV: 107 fL — ABNORMAL HIGH (ref 80.0–100.0)
Platelets: 145 10*3/uL — ABNORMAL LOW (ref 150–400)
RBC: 3.86 MIL/uL — ABNORMAL LOW (ref 3.87–5.11)
RDW: 13.4 % (ref 11.5–15.5)
WBC: 4.7 10*3/uL (ref 4.0–10.5)
nRBC: 0 % (ref 0.0–0.2)

## 2020-01-26 LAB — COMPREHENSIVE METABOLIC PANEL
ALT: 23 U/L (ref 0–44)
AST: 31 U/L (ref 15–41)
Albumin: 3.9 g/dL (ref 3.5–5.0)
Alkaline Phosphatase: 47 U/L (ref 38–126)
Anion gap: 12 (ref 5–15)
BUN: 29 mg/dL — ABNORMAL HIGH (ref 8–23)
CO2: 27 mmol/L (ref 22–32)
Calcium: 9.4 mg/dL (ref 8.9–10.3)
Chloride: 104 mmol/L (ref 98–111)
Creatinine, Ser: 1.97 mg/dL — ABNORMAL HIGH (ref 0.44–1.00)
GFR calc Af Amer: 24 mL/min — ABNORMAL LOW (ref >60)
GFR calc non Af Amer: 21 mL/min — ABNORMAL LOW (ref >60)
Glucose, Bld: 93 mg/dL (ref 70–99)
Potassium: 4.3 mmol/L (ref 3.5–5.1)
Sodium: 143 mmol/L (ref 135–145)
Total Bilirubin: 1.1 mg/dL (ref 0.3–1.2)
Total Protein: 7.4 g/dL (ref 6.5–8.1)

## 2020-01-26 LAB — DIFFERENTIAL
Abs Immature Granulocytes: 0.01 10*3/uL (ref 0.00–0.07)
Basophils Absolute: 0 10*3/uL (ref 0.0–0.1)
Basophils Relative: 0 %
Eosinophils Absolute: 0 10*3/uL (ref 0.0–0.5)
Eosinophils Relative: 1 %
Immature Granulocytes: 0 %
Lymphocytes Relative: 34 %
Lymphs Abs: 1.6 10*3/uL (ref 0.7–4.0)
Monocytes Absolute: 0.7 10*3/uL (ref 0.1–1.0)
Monocytes Relative: 15 %
Neutro Abs: 2.3 10*3/uL (ref 1.7–7.7)
Neutrophils Relative %: 50 %

## 2020-01-26 LAB — URINALYSIS, ROUTINE W REFLEX MICROSCOPIC
Bacteria, UA: NONE SEEN
Bilirubin Urine: NEGATIVE
Glucose, UA: NEGATIVE mg/dL
Hgb urine dipstick: NEGATIVE
Ketones, ur: NEGATIVE mg/dL
Leukocytes,Ua: NEGATIVE
Nitrite: NEGATIVE
Protein, ur: 100 mg/dL — AB
Specific Gravity, Urine: 1.01 (ref 1.005–1.030)
pH: 8 (ref 5.0–8.0)

## 2020-01-26 LAB — RAPID URINE DRUG SCREEN, HOSP PERFORMED
Amphetamines: NOT DETECTED
Barbiturates: NOT DETECTED
Benzodiazepines: NOT DETECTED
Cocaine: NOT DETECTED
Opiates: NOT DETECTED
Tetrahydrocannabinol: NOT DETECTED

## 2020-01-26 LAB — ETHANOL: Alcohol, Ethyl (B): <10 mg/dL (ref <10)

## 2020-01-26 LAB — APTT: aPTT: 31 seconds (ref 24–36)

## 2020-01-26 LAB — PROTIME-INR
INR: 1.9 — ABNORMAL HIGH (ref 0.8–1.2)
Prothrombin Time: 20.7 seconds — ABNORMAL HIGH (ref 11.4–15.2)

## 2020-01-26 LAB — AMMONIA: Ammonia: 18 umol/L (ref 9–35)

## 2020-01-26 LAB — SARS CORONAVIRUS 2 BY RT PCR (HOSPITAL ORDER, PERFORMED IN ~~LOC~~ HOSPITAL LAB): SARS Coronavirus 2: NEGATIVE

## 2020-01-26 LAB — TROPONIN I (HIGH SENSITIVITY)
Troponin I (High Sensitivity): 17 ng/L (ref <18)
Troponin I (High Sensitivity): 16 ng/L (ref <18)

## 2020-01-26 LAB — CBG MONITORING, ED: Glucose-Capillary: 82 mg/dL (ref 70–99)

## 2020-01-26 MED ORDER — NITROGLYCERIN 0.4 MG SL SUBL
0.4000 mg | SUBLINGUAL_TABLET | SUBLINGUAL | Status: DC | PRN
  Administered 2020-01-28: 0.4 mg via SUBLINGUAL
  Filled 2020-01-26: qty 1

## 2020-01-26 MED ORDER — LACTATED RINGERS IV BOLUS
500.0000 mL | Freq: Once | INTRAVENOUS | Status: AC
  Administered 2020-01-26: 500 mL via INTRAVENOUS

## 2020-01-26 MED ORDER — ASPIRIN EC 81 MG PO TBEC
81.0000 mg | DELAYED_RELEASE_TABLET | Freq: Every day | ORAL | Status: DC
  Administered 2020-01-27: 81 mg via ORAL
  Filled 2020-01-26: qty 1

## 2020-01-26 MED ORDER — HYDRALAZINE HCL 20 MG/ML IJ SOLN
5.0000 mg | Freq: Once | INTRAMUSCULAR | Status: AC
  Administered 2020-01-26: 5 mg via INTRAVENOUS
  Filled 2020-01-26: qty 1

## 2020-01-26 NOTE — ED Triage Notes (Signed)
Code Stroke paged for pt. Pt presenting with right arm drift, left and right leg drift. Last known well was 1030am. NIH 3

## 2020-01-26 NOTE — ED Notes (Signed)
Last known well reported by family now reported at [place] rather than 1400

## 2020-01-26 NOTE — ED Provider Notes (Signed)
[place] EMERGENCY DEPARTMENT Provider Note   CSN: [id number] Arrival date & time: 01/26/20  1555     History Chief Complaint  Patient presents with  . Chest Pain    Allison Rhodes is a 86 y.o. female.  HPI Patient is a 86 year old female who presents for acute onset of weakness.  At baseline, she is alert and oriented x4.  She walks with a cane at home.  She is hard of hearing, without hearing aids.  Medical history is notable for history of DVT and PE.  She is currently on warfarin.  She takes HCTZ for hypertension.  She has been prescribed Lasix, but has not taken any in a while.  This morning, she appeared normal to her daughter, who she lives with.  She is able to get up, eat breakfast.  Around noon, she went down to take a nap.  She woke up at 2 PM and had what appeared to be bilateral leg weakness.  She was unable to walk with her cane, as she normally does.  EMS was called.  Patient had also reported left-sided chest pain.  This was the focus of EMS during transit.  She was given 324 of ASA.  Blood pressure was noted to be elevated, however on manual BP cuff with EMS and upon arrival in the ED, her blood pressure was approximately 150/90.  Upon arrival in the ED she no longer endorses any chest pain.  She does have chronic left shoulder pain.  She denies any pain anywhere else.    Past Medical History:  Diagnosis Date  . Arthritis    "qwhere" (03/31/2018)  . Chronic atrial fibrillation ([place])   . CKD (chronic kidney disease), stage III ([place])   . DVT (deep venous thrombosis) ([place]) 2002   ?LLE  . Heart murmur   . Hyperlipidemia   . Hypertension   . Osteoporosis   . Pneumonia 2018  . Pulmonary embolism ([place]) 2003  . Stroke [facility]) 03/31/2018   "unsteady on her feet" (03/31/2018)    Patient Active Problem List   Diagnosis Date Noted  . TIA (transient ischemic attack) 03/31/2018  . Hypertension 03/31/2018  . Hyperlipemia 03/31/2018  . History of  pulmonary embolism 03/31/2018  . CKD (chronic kidney disease) 03/31/2018    Past Surgical History:  Procedure Laterality Date  . CATARACT EXTRACTION W/ INTRAOCULAR LENS  IMPLANT, BILATERAL Bilateral      OB History   No obstetric history on file.     Family History  Problem Relation Age of Onset  . Breast cancer Neg Hx     Social History   Tobacco Use  . Smoking status: Never Smoker  . Smokeless tobacco: Never Used  Substance Use Topics  . Alcohol use: Never  . Drug use: Never    Home Medications Prior to Admission medications   Medication Sig Start Date End Date Taking? Authorizing Provider  atorvastatin (LIPITOR) 40 MG tablet Take 1 tablet (40 mg total) by mouth daily. 04/01/18   Georgette Shell, MD  diclofenac sodium (VOLTAREN) 1 % GEL Apply 4 g topically 3 (three) times daily as needed for pain. 03/21/18   [provider]  fluticasone (FLONASE) 50 MCG/ACT nasal spray Place 1 spray into both nostrils daily as needed for allergies or rhinitis.    [provider]  furosemide (LASIX) 20 MG tablet Take 20 mg by mouth as needed for fluid.    [provider]  hydrochlorothiazide (HYDRODIURIL) 25 MG  tablet Take 25 mg by mouth daily. 02/15/18   [provider]  JANTOVEN 3 MG tablet Take 3.5 mg by mouth daily. 02/14/18   [provider]  loratadine (CLARITIN) 10 MG tablet Take 10 mg by mouth daily as needed for allergies.    [provider]  Multiple Vitamins-Minerals (CENTRUM ADULTS PO) Take 1 tablet by mouth daily.    [provider]  Vitamin D, Ergocalciferol, 2000 units CAPS Take 1 tablet by mouth daily.    [provider]  warfarin (JANTOVEN) 1 MG tablet Take 0.5 mg by mouth See admin instructions. Taking 1/2 tablet 0.5mg  with the 3mg   (  For 3.5mg  total)     [provider]    Allergies    Patient has no known allergies.  Review of Systems   Review of Systems  Constitutional: Negative  for chills and fever.  HENT: Negative for ear pain and sore throat.   Eyes: Negative for pain and visual disturbance.  Respiratory: Negative for cough and shortness of breath.   Cardiovascular: Positive for chest pain. Negative for palpitations.  Gastrointestinal: Negative for abdominal pain, diarrhea, nausea and vomiting.  Genitourinary: Positive for decreased urine volume.  Musculoskeletal: Positive for gait problem. Negative for back pain.  Skin: Negative for color change and rash.  Neurological: Positive for weakness. Negative for seizures, syncope, facial asymmetry and speech difficulty.  Hematological: Bruises/bleeds easily (On warfarin).  Psychiatric/Behavioral: Negative for confusion and decreased concentration.  All other systems reviewed and are negative.   Physical Exam Updated Vital Signs BP (!) 179/100   Pulse 60   Resp 20   SpO2 94%   Physical Exam Vitals and nursing note reviewed. Exam conducted with a chaperone present.  Constitutional:      General: She is not in acute distress.    Appearance: She is well-developed. She is not toxic-appearing or diaphoretic.  HENT:     Head: Normocephalic and atraumatic.     Right Ear: External ear normal.     Left Ear: External ear normal.     Nose: Nose normal.     Mouth/Throat:     Mouth: Mucous membranes are moist.     Pharynx: Oropharynx is clear.  Eyes:     Extraocular Movements: Extraocular movements intact.     Conjunctiva/sclera: Conjunctivae normal.  Cardiovascular:     Rate and Rhythm: Normal rate. Rhythm irregular.     Heart sounds: No murmur.  Pulmonary:     Effort: Pulmonary effort is normal. No respiratory distress.     Breath sounds: Normal breath sounds. No wheezing or rales.  Chest:     Chest wall: No tenderness.  Abdominal:     General: Abdomen is flat.     Palpations: Abdomen is soft.     Tenderness: There is no abdominal tenderness. There is no right CVA tenderness, left CVA tenderness or  guarding.  Musculoskeletal:        General: No tenderness, deformity or signs of injury.     Cervical back: Neck supple. No tenderness.  Skin:    General: Skin is warm and dry.  Neurological:     Mental Status: She is alert.     Cranial Nerves: Cranial nerves are intact. No cranial nerve deficit, dysarthria or facial asymmetry.     Sensory: Sensory deficit (Diminished on left hemibody) present.     Motor: Tremor (Intention) present. No weakness.     Coordination: Coordination is intact. Finger-Nose-Finger Test normal.  ED Results / Procedures / Treatments   Labs (all labs ordered are listed, but only abnormal results are displayed) Labs Reviewed - No data to display  EKG EKG Interpretation  Date/Time:  Saturday Jan 26 2020 16:02:11 EDT Ventricular Rate:  92 PR Interval:    QRS Duration: 102 QT Interval:  379 QTC Calculation: 424 R Axis:   -12 Text Interpretation: probable atrial fibrillation Consider right atrial enlargement Abnormal R-wave progression, early transition Abnormal T, consider ischemia, lateral leads Artifact in lead(s) I III aVR aVL aVF Confirmed by Malvin Johns ([id number]) on 01/26/2020 4:04:22 PM   Radiology No results found.  Procedures Procedures (including critical care time)  Medications Ordered in ED Medications - No data to display  ED Course  I have reviewed the triage vital signs and the nursing notes.  Pertinent labs & imaging results that were available during my care of the patient were reviewed by me and considered in my medical decision making (see chart for details).    MDM Rules/Calculators/A&P                      Patient is a 86 year old female with history of A. fib (on warfarin), DVT, PE, TIA, who presents for the acute onset of weakness.  Additionally, prior to arrival she endorsed left-sided chest pain.  This had resolved by the time she arrived in the ED.  She was normal prior to 10 AM when she was walking around the house with  a walker.  When she got up from her nap at 2 PM, weakness and gait instability were seen by family.  On exam, she appeared to have left hemibody sensory deficits.  No facial asymmetry or motor strength deficits were apparent.  Given the history of acute onset weakness and inability to walk in addition to exam findings of sensory deficits, code stroke was called.  Noncontrasted CT of head showed no changes from prior study in 2019.  Per neurology recommendation, MRI was ordered.  Laboratory results showed normal electrolytes, baseline creatinine, elevated BUN (29), no leukocytosis, normal hemoglobin, INR of 1.9 (slightly subtherapeutic), normal troponin, and no evidence of urinary infection.  MRI showed acute lacunar type infarct.  On reassessment, patient remained stable.  She was accompanied by her daughter, who did not report any new changes.  Per neurology guidance, patient was admitted to medicine service for stroke work-up.  Final Clinical Impression(s) / ED Diagnoses Final diagnoses:  None    Rx / DC Orders ED Discharge Orders    None       Godfrey Pick, MD 01/27/20 4098    Malvin Johns, MD 01/30/20 1243

## 2020-01-26 NOTE — H&P (Signed)
[facility] History and Physical  Allison Rhodes CNO:[id number] DOB: Sep 05, 1922 DOA: 01/26/2020  Referring EDP: Allison Rhodes PCP: Allison Seashore, MD   Chief Complaint: Weakness  HPI: Allison Rhodes is a 86 y.o. female with PMH of HTN, HLD, a Fib on Warfarin and hx of DVT/PE presented to ED with weakness and admitted for acute lacunar infarct.   History provided by patient and daughter who is at bedside. Reports patient was in her usual state of health this morning. She took a nap this afternoon and when she woke up, she was very weak. Normally she requires minimal assistance to ambulate but her daughter had to basically hold her up and then called EMS. Weakness not localized to either side. Denies facial assymetry, speech difficulty, confusion or other complaints. Patient complained of chest pain when EMS arrived and was given Aspirin. Denies chest pain currently. Also denies headache, dizziness, fever, chills, cough, SOB, abdominal pain, nausea, vomiting, diarrhea, constipation, dysuria, hematuria, hematochezia, melena, speech difficulty, trouble eating, confusion or any other complaints.  In the ED: Hypertensive and intermittently tachycardic otherwise stable on room air. Labs remarkable for glucose 82, INR 1.9. CBC/PTT/Ethanol level WNL. Trop neg x2. Cr 1.97. UDS neg. UA without evidence of infection. Ammonia pending.   CT Head: Stable cerebral white matter disease since 2019. No acute intracranial hemorrhage or cortically based infarct identified.  CXR: Limited study due to positioning. No acute abnormalities are noted. Mild atelectasis in the left base.  MRI Brain:  1. Acute lacunar type infarct extends from the posterior left corona radiata to the left motor strip in the right upper extremity representation area. No associated hemorrhage or mass effect.  2. No other acute intracranial abnormality. Underlying chronic small vessel disease otherwise stable to minimally  increased since 2019.  Neuro consulted and gave recommendation upon admission.   Review of Systems:  All other systems negative unless noted above in HPI.   Past Medical History:  Diagnosis Date  . Arthritis    "qwhere" (03/31/2018)  . Chronic atrial fibrillation ([place])   . CKD (chronic kidney disease), stage III   . DVT (deep venous thrombosis) ([place]) 2002   ?LLE  . Heart murmur   . Hyperlipidemia   . Hypertension   . Osteoporosis   . Pneumonia 2018  . Pulmonary embolism ([place]) 2003  . Stroke [facility]) 03/31/2018   "unsteady on her feet" (03/31/2018)   Past Surgical History:  Procedure Laterality Date  . CATARACT EXTRACTION W/ INTRAOCULAR LENS  IMPLANT, BILATERAL Bilateral    Social History:  reports that she has never smoked. She has never used smokeless tobacco. She reports that she does not drink alcohol or use drugs.  No Known Allergies  Family History  Problem Relation Age of Onset  . Breast cancer Neg Hx     Prior to Admission medications   Medication Sig Start Date End Date Taking? Authorizing Provider  atorvastatin (LIPITOR) 40 MG tablet Take 1 tablet (40 mg total) by mouth daily. 04/01/18  Yes Georgette Shell, MD  Cyanocobalamin (VITAMIN B 12 PO) Take 1 tablet by mouth daily.   Yes [provider]  diclofenac sodium (VOLTAREN) 1 % GEL Apply 4 g topically 3 (three) times daily as needed for pain. 03/21/18  Yes [provider]  fluticasone (FLONASE) 50 MCG/ACT nasal spray Place 1 spray into both nostrils daily as needed for allergies or rhinitis.   Yes [provider]  hydrochlorothiazide (HYDRODIURIL) 25 MG tablet Take 25 mg by mouth  daily. 02/15/18  Yes [provider]  JANTOVEN 3 MG tablet Take 3.5 mg by mouth daily. 02/14/18  Yes [provider]  loratadine (CLARITIN) 10 MG tablet Take 10 mg by mouth daily as needed for allergies.   Yes [provider]  Multiple Vitamins-Minerals (CENTRUM ADULTS PO) Take 1 tablet  by mouth daily.   Yes [provider]  Vitamin D, Ergocalciferol, 2000 units CAPS Take 1 tablet by mouth daily.   Yes [provider]  warfarin (JANTOVEN) 1 MG tablet Take 0.5 mg by mouth See admin instructions. Taking 1/2 tablet 0.5mg  with the 3mg   (  For 3.5mg  total)    Yes [provider]   Physical Exam: Vitals:   01/26/20 1656 01/26/20 1730 01/26/20 2005 01/26/20 2055  BP: (!) 177/122 (!) 165/86 (!) 161/136 (!) 174/110  Pulse: (!) 104  67 74  Resp: 17 13 20 20   Temp: 98.7 F (37.1 C)     TempSrc: Oral     SpO2: 100%  100% 98%    Wt Readings from Last 3 Encounters:  05/16/18 69.4 kg  04/01/18 72.1 kg    . General:  Appears calm and comfortable. AAOx4.  . Eyes: EOMI, normal lids, irises & conjunctiva . ENT: hard of hearing hearing, lips & tongue . Neck: normal ROM . Cardiovascular: Irregularly irregular rhythm, no m/r/g. No LE edema. Marland Kitchen Respiratory: CTA bilaterally, no w/r/r. Normal respiratory effort. . Abdomen: soft, ntnd . Skin: no rash or induration seen on limited exam . Musculoskeletal: grossly normal tone BUE/BLE . Psychiatric: grossly normal mood and affect, speech fluent and appropriate . Neurologic: No facial asymmetry noted. BUE with strength 4/5. BLE strength 3/5 but symmetric. Follows commands. Speech fluent.          Labs on Admission:  Basic Metabolic Panel: Recent Labs  Lab 01/26/20 1628 01/26/20 1629  NA 143 143  K 4.3 4.1  CL 104 107  CO2 27  --   GLUCOSE 93 89  BUN 29* 32*  CREATININE 1.97* 1.90*  CALCIUM 9.4  --    Liver Function Tests: Recent Labs  Lab 01/26/20 1628  AST 31  ALT 23  ALKPHOS 47  BILITOT 1.1  PROT 7.4  ALBUMIN 3.9   No results for input(s): LIPASE, AMYLASE in the last 168 hours. No results for input(s): AMMONIA in the last 168 hours. CBC: Recent Labs  Lab 01/26/20 1628 01/26/20 1629  WBC 4.7  --   NEUTROABS 2.3  --   HGB 13.2 13.9  HCT 41.3 41.0  MCV 107.0*  --   PLT 145*  --     Cardiac Enzymes: No results for input(s): CKTOTAL, CKMB, CKMBINDEX, TROPONINI in the last 168 hours.  BNP (last 3 results) No results for input(s): BNP in the last 8760 hours.  ProBNP (last 3 results) No results for input(s): PROBNP in the last 8760 hours.  CBG: Recent Labs  Lab 01/26/20 1619  GLUCAP 82    Radiological Exams on Admission: MR BRAIN WO CONTRAST  Result Date: 01/26/2020 CLINICAL DATA:  86 year old female code stroke presentation with left side weakness. EXAM: MRI HEAD WITHOUT CONTRAST TECHNIQUE: Multiplanar, multiecho pulse sequences of the brain and surrounding structures were obtained without intravenous contrast. COMPARISON:  Head CT earlier today. Brain MRI 03/31/2018. FINDINGS: Brain: 18 mm somewhat linear area of restricted diffusion tracking from the posterior left corona radiata to the left motor strip near the right upper extremity representation area (series 5, image 46 and series  7, image 86). Faint associated T2 and FLAIR hyperintensity with no hemorrhage or mass effect. No other convincing restricted diffusion. Chronic microhemorrhages in the bilateral deep gray nuclei especially the right thalamus with some associated DWI artifact. Small chronic infarct in the left cerebellum is more apparent than in 2019 on series 10, image 5. Confluent bilateral white matter T2 and FLAIR hyperintensity appears stable. No definite chronic cortical encephalomalacia. No midline shift, mass effect, evidence of mass lesion, ventriculomegaly, extra-axial collection or acute intracranial hemorrhage. Cervicomedullary junction and pituitary are within normal limits. Vascular: Major intracranial vascular flow voids are stable since 2019. Skull and upper cervical spine: Multilevel chronic cervical spine degeneration, up to mild associated degenerative spinal stenosis (series 9, image 13). But bone marrow signal appears normal. Sinuses/Orbits: Stable and negative. Other: Mastoids remain  clear. Visible internal auditory structures appear normal. IMPRESSION: 1. Acute lacunar type infarct extends from the posterior left corona radiata to the left motor strip in the right upper extremity representation area. No associated hemorrhage or mass effect. 2. No other acute intracranial abnormality. Underlying chronic small vessel disease otherwise stable to minimally increased since 2019. Electronically Signed   By: Genevie Ann M.D.   On: 01/26/2020 18:45   DG Chest Portable 1 View  Result Date: 01/26/2020 CLINICAL DATA:  Chest pain EXAM: PORTABLE CHEST 1 VIEW COMPARISON:  March 31, 2018 FINDINGS: The study is limited due to patient rotation. Stable cardiomegaly. The hila and mediastinum are unremarkable. No pneumothorax. No nodules or masses. Mild atelectasis in the left base. No other acute abnormalities. IMPRESSION: Limited study due to positioning. No acute abnormalities are noted. Mild atelectasis in the left base. Electronically Signed   By: Dorise Bullion III M.D   On: 01/26/2020 17:14   CT HEAD CODE STROKE WO CONTRAST  Result Date: 01/26/2020 CLINICAL DATA:  Code stroke. 86 year old female with left side weakness. Last seen normal noon. EXAM: CT HEAD WITHOUT CONTRAST TECHNIQUE: Contiguous axial images were obtained from the base of the skull through the vertex without intravenous contrast. COMPARISON:  Head CT and brain MRI 03/31/2018. FINDINGS: Brain: Stable cerebral volume since 2019. Stable ventricle size and configuration. No midline shift, mass effect, or evidence of intracranial mass lesion. Chronic dystrophic basal ganglia calcifications. No acute intracranial hemorrhage identified. Patchy and confluent bilateral cerebral white matter hypodensity. Stable gray-white matter differentiation throughout the brain. No cortically based acute infarct identified. Vascular: Calcified atherosclerosis at the skull base. No suspicious intracranial vascular hyperdensity. Skull: Stable. Hyperostosis of  the calvarium. Sinuses/Orbits: Visualized paranasal sinuses and mastoids are stable and well pneumatized. Other: No acute orbit or scalp soft tissue finding. ASPECTS [facility] Stroke Program Early CT Score) Total score (0-10 with 10 being normal): 10 IMPRESSION: 1. Stable cerebral white matter disease since 2019. No acute intracranial hemorrhage or cortically based infarct identified. ASPECTS 10. 2. These results were communicated to Dr. Rory Percy at 4:42 pmon 5/22/2021by text page via the [facility] messaging system. Electronically Signed   By: Genevie Ann M.D.   On: 01/26/2020 16:42    EKG: Independently reviewed. HR 90. Atrial fib. QTc 424. No STEMI.  Assessment/Plan Principal Problem:   Lacunar infarct, acute (HCC) Active Problems:   Hypertension   Hyperlipemia   History of pulmonary embolism   CKD (chronic kidney disease)   Atrial fibrillation, chronic (HCC)   Chronic anticoagulation   Hypertensive urgency  86 y.o. female with PMH of HTN, HLD, a Fib on Warfarin and hx of DVT/PE presented to ED with weakness and  admitted for acute lacunar infarct.   Acute Lacunar Infarct; Left - presented with weakness; non-focal findings on exam - MRI as above - Neuro consulted and will follow - Frequent Neuro checks - Trop neg x2 - UA, CXR and CMP WNL - Tele - NPO until passes swallow screen - Per Neuro: BP goal (given both stroke v HTN urgency in differential) - do not treat unless more than 180 SBP on a PRN basis - Lipid panel and A1c ordered - Last Echo and Carotid US in 2019; repeat PRN - IVF over next 10 hours while NPO - Ammonia level pending - Daily ASA 81 mg  HTN Hypertensive Urgency - Daughter reports BP's normally < 120/90 at home  - Hold home HCTZ per Neuro - PRN Hydralazine for SBP > 180  Atrial Fibrillation Hx DVT/PE - cont Warfarin - not on any rate control - Daily INR  CKD - At baseline - Cont to monitor  HLD - cont PTA Statin  - recheck lipid panel   Code Status: Full;  discussed with patient and daughter DVT Prophylaxis: PTA Coumadin Family Communication: Daughter at bedside Disposition Plan: Admit to inpatient. Patient requiring specialty consultation and further workup for infarct. Anticipate discharge home in 2-3 days. Patient is at high risk for further decompensation due to age and co-morbidities.    Time spent: 70 minutes  Chauncey Mann, MD [facility] Pager [phone]

## 2020-01-26 NOTE — ED Triage Notes (Signed)
Pt presents from home with EMS for L CP that radiated to L shoulder, pain present upon arrival, 324 ASA given pta. Pt has also been weaker than normal, requiring help from family to ambulate with cane. Otherwise at baseline cognitively, has been eating/drinking normally. A&Ox4 in exam room of ED.  H/o HTN, DVT

## 2020-01-26 NOTE — Consult Note (Addendum)
NEURO HOSPITALIST CONSULT NOTE   Requesting Physician: Dr. Lenor Derrick    Chief Complaint: Lower extremity weakness and sensory changes  History obtained from:  Patient and Daughter   HPI:                                                                                                                                         Allison Rhodes is an 86 y.o. female with a past history hypertension, hyperlipidemia, atrial fibrillation, DVT 2002, PE 2003, on Coumadin. Admitted in 2019 for hypertensive urgency and possible TIA and followed-up for that event at [facility] with Dr. Leonie Man.  Baseline according to her daughter, the patient is able to ambulate with a cane. She requires assistance with meals and bathing, but is able to dress herself. Her daughter reports some trouble with her short-term memory.   She presented today after waking from a nap with lower extremity weakness, unable to ambulate as normal with her cane. Her last known well was 10:30am. EMS was called and during transport she complained of chest pain and aspirin was given. Upon arrival to the ED her chest pain had resolved. Her blood pressure was 179/100. Upon exam the ED resident identified sensation changes and lower extremity weakness and a code stroke was called.   No illness or sickness prior to this.  No sick contacts.  No chest pain shortness of breath nausea vomiting. Daughter endorses decreased p.o. intake and decreased fluid intake.  Date last known well: Date: 01/26/2020 Time last known well: Time: 10:30 tPA Given: No: Outside tPA window. She was also not a candidate because she is on anticoagulation (Coumadin) baseline.   Modified Rankin: Rankin Score=4   Past Medical History:  Diagnosis Date  . Arthritis    "qwhere" (03/31/2018)  . Chronic atrial fibrillation ([place])   . CKD (chronic kidney disease), stage III   . DVT (deep venous thrombosis) ([place]) 2002   ?LLE  . Heart murmur    . Hyperlipidemia   . Hypertension   . Osteoporosis   . Pneumonia 2018  . Pulmonary embolism ([place]) 2003  . Stroke [facility]) 03/31/2018   "unsteady on her feet" (03/31/2018)    Past Surgical History:  Procedure Laterality Date  . CATARACT EXTRACTION W/ INTRAOCULAR LENS  IMPLANT, BILATERAL Bilateral     Family History  Problem Relation Age of Onset  . Breast cancer Neg Hx          Social History:  reports that she has never smoked. She has never used smokeless tobacco. She reports that she does not drink alcohol or use drugs.  Allergies: No Known Allergies  Medications:  I have reviewed the patient's current medications.  Current Facility-Administered Medications:  .  hydrALAZINE (APRESOLINE) injection 5 mg, 5 mg, Intravenous, Once, Dixon, Ryan, MD .  lactated ringers bolus 500 mL, 500 mL, Intravenous, Once, Godfrey Pick, MD .  nitroGLYCERIN (NITROSTAT) SL tablet 0.4 mg, 0.4 mg, Sublingual, Q5 min PRN, Godfrey Pick, MD  Current Outpatient Medications:  .  atorvastatin (LIPITOR) 40 MG tablet, Take 1 tablet (40 mg total) by mouth daily., Disp: 30 tablet, Rfl: 0 .  Cyanocobalamin (VITAMIN B 12 PO), Take 1 tablet by mouth daily., Disp: , Rfl:  .  diclofenac sodium (VOLTAREN) 1 % GEL, Apply 4 g topically 3 (three) times daily as needed for pain., Disp: , Rfl: 2 .  fluticasone (FLONASE) 50 MCG/ACT nasal spray, Place 1 spray into both nostrils daily as needed for allergies or rhinitis., Disp: , Rfl:  .  hydrochlorothiazide (HYDRODIURIL) 25 MG tablet, Take 25 mg by mouth daily., Disp: , Rfl:  .  JANTOVEN 3 MG tablet, Take 3.5 mg by mouth daily., Disp: , Rfl:  .  loratadine (CLARITIN) 10 MG tablet, Take 10 mg by mouth daily as needed for allergies., Disp: , Rfl:  .  Multiple Vitamins-Minerals (CENTRUM ADULTS PO), Take 1 tablet by mouth daily., Disp: , Rfl:  .  Vitamin D,  Ergocalciferol, 2000 units CAPS, Take 1 tablet by mouth daily., Disp: , Rfl:  .  warfarin (JANTOVEN) 1 MG tablet, Take 0.5 mg by mouth See admin instructions. Taking 1/2 tablet 0.5mg  with the 3mg   (  For 3.5mg  total) , Disp: , Rfl:    ROS:                                                                                                                                       Obtained from the daughter and pertinent positives in the HPI: Rest of the review negative.   General Examination:                                                                                                      Blood pressure (!) 177/122, pulse (!) 104, temperature 98.7 F (37.1 C), temperature source Oral, resp. rate 17, SpO2 100 %.  Physical Exam  Constitutional: Appears well-developed and well-nourished.  Psych: Affect appropriate to situation Eyes: Normal external eye and conjunctiva. HENT: Normocephalic, no lesions, without obvious abnormality.   Musculoskeletal-no joint tenderness, deformity or swelling Cardiovascular: Normal rate and regular rhythm.  Respiratory: Effort normal, non-labored breathing saturations WNL  GI: Soft.  No distension. There is no tenderness.  Skin: WDI  Neurological Examination Mental status: Alert, oriented to place. Speech fluent without evidence of aphasia.  Able to follow commands without difficulty. Cranial nerves: Pupils equal round reactive to light, extraocular movements intact, visual fields full, face symmetric, facial sensation intact, tongue intact midline Motor exam: There is drift in right leg, left leg and right arm  Sensory: She has diminished sensation on the right face and leg but left arm-inconsistent exam Coordination: No dysmetria NIHSS: 4  Lab Results: Basic Metabolic Panel: Recent Labs  Lab 01/26/20 1628 01/26/20 1629  NA 143 143  K 4.3 4.1  CL 104 107  CO2 27  --   GLUCOSE 93 89  BUN 29* 32*  CREATININE 1.97* 1.90*  CALCIUM 9.4  --     CBC: Recent Labs  Lab 01/26/20 1628 01/26/20 1629  WBC 4.7  --   NEUTROABS 2.3  --   HGB 13.2 13.9  HCT 41.3 41.0  MCV 107.0*  --   PLT 145*  --    CBG: Recent Labs  Lab 01/26/20 1619  GLUCAP 82   Imaging: CT HEAD CODE STROKE WO CONTRAST  Result Date: 01/26/2020 CLINICAL DATA:  Code stroke. 86 year old female with left side weakness. Last seen normal noon. EXAM: CT HEAD WITHOUT CONTRAST TECHNIQUE: Contiguous axial images were obtained from the base of the skull through the vertex without intravenous contrast. COMPARISON:  Head CT and brain MRI 03/31/2018. FINDINGS: Brain: Stable cerebral volume since 2019. Stable ventricle size and configuration. No midline shift, mass effect, or evidence of intracranial mass lesion. Chronic dystrophic basal ganglia calcifications. No acute intracranial hemorrhage identified. Patchy and confluent bilateral cerebral white matter hypodensity. Stable gray-white matter differentiation throughout the brain. No cortically based acute infarct identified. Vascular: Calcified atherosclerosis at the skull base. No suspicious intracranial vascular hyperdensity. Skull: Stable. Hyperostosis of the calvarium. Sinuses/Orbits: Visualized paranasal sinuses and mastoids are stable and well pneumatized. Other: No acute orbit or scalp soft tissue finding. ASPECTS [facility] Stroke Program Early CT Score) Total score (0-10 with 10 being normal): 10 IMPRESSION: 1. Stable cerebral white matter disease since 2019. No acute intracranial hemorrhage or cortically based infarct identified. ASPECTS 10. 2. These results were communicated to Dr. Rory Percy at 4:42 pmon 5/22/2021by text page via the [facility] messaging system. Electronically Signed   By: Genevie Ann M.D.   On: 01/26/2020 16:42    01/26/2020, 5:08 PM   Assessment: 86 y.o. female with a past history hypertension, hyperlipidemia, atrial fibrillation, DVT 2002, PE 2003, on Coumadin. Admitted in 2019 for hypertensive urgency and possible  TIA. She presented today after waking from a nap with lower extremity weakness, unable to ambulate as normal with her cane. After the ED resident exam identified sensation changes and lower extremity weakness, a code stroke was called. Upon neurologist exam by Dr. Rory Percy, she had drift in her bilateral lower extremities and right upper extremity. She reported decreased sensation in her right leg/face and left arm. NIHSS: 4. Last known well was 10:30am. She presented outside of the tPA window and was also not a candidate for tPA due to anticoagulation with coumadin. STAT Head CT ordered and no evidence of acute ischemic stroke or hemorraghic stroke. ASPECTS 10.   Impression: -Possible acute ischemic stroke versus possible hypertensive urgency. Previous admission in 2019 for hypertensive urgency. Brain MRI recommended to rule out stroke.    Recommendations: -- Brain MRI to rule out Stroke suggested to ED team.  If positive admit for stroke work-up.  -- BP goal : given HTN urgency in the past and also concern for possible stroke - will do permissive only up to 180. --NPO until passes stroke swallow screen   --Consider PT consult, OT consult for extremity weakness --Telemetry monitoring --Frequent neuro checks Q2H --UA, Chest Xray, Ammonia --Troponin twelve-lead EKG --Can continue anticoagulation unless MRI shows large infarct. Feel free to call back if MRI is positive for stroke  Gwenyth Bouillon, DNP, FNP-C [facility] Nurse Practitioner  Additional recommendations to follow from attending neurologist, Dr. Rory Percy.   Attending addendum- A. Rory Percy, MD Patient seen and examined as an acute code stroke at the request of Dr. Malvin Johns.   I have seen and examined the patient.  I have independently reviewed imaging.  CC: Code stroke for left-sided weakness  HPI: She was brought in by EMS, initially for left-sided chest pain with radiation to the shoulder, given aspirin 325 prior to  arrival.  When evaluated by the emergency room resident, she endorsed left face arm and leg numbness-and also endorsed the last known normal time to be within the TPA window initially and hence a code stroke was activated. Upon my history which I gathered from the daughter, she reports that the patient was last normal sometime around 10:30 AM today.  She then went on to take a nap and when she woke up around 230, she felt very weak and was unable to walk to the bathroom by herself and almost fell with not having to support her whole weight.  At baseline otherwise she is able to use a cane, requires assistance with meals and bathing but able to dress herself.  She has extremely poor short-term memory but has intact long-term memory. To EMS, she had complained of some chest discomfort and was brought in as like chest pain and a code stroke was not activated in the field.  When the ED resident examine her, she did endorse some left-sided numbness concerning for a focal neurological deficit and also confusion. In the past, she has been seen by neurology for concern for TIA versus hypertensive emergency in 2019. MRI at that time was negative for stroke. On Coumadin for DVT/PE.  Last INR was 2.1 per daughter. Diminished p.o. intake noted by daughter especially for fluids. She was hypertensive on this presentation as well.  Review of systems: Noted some chest discomfort that is now resolved.  Daughter denied any preceding illness sickness.  No chest pain shortness of breath nausea vomiting.  No fever chills.  No exposure to Covid patients.  Rest of the review negative.  Pertinent positives documented in the HPI.  Past medical history: TIA, DVT, PE, on Coumadin, hypertension Past surgical history as above includes cataract extraction Social history: No history of tobacco alcohol or drug use Examination:  Vitals with BMI 01/26/2020 01/26/2020 01/26/2020  Height - - -  Weight - - -  BMI - - -  Systolic 242 -  -  Diastolic 353 - -  Pulse 614 92 79   General examination: Awake and alert slow to respond to questions HEENT: Normocephalic/atraumatic CVs: Irregularly irregular Respiratory: Breathing normally saturating well on room air Abdomen nondistended nontender Extremities with 1+ pedal edema bilaterally. Neurologic examination She is awake, slow to respond to questions She is oriented to the month. Her speech is not dysarthric There is no evidence of aphasia.  She can only name simple objects consistently. Cranial nerves: Pupils are equal round reactive  light, extraocular movements intact, visual fields are full, face appears symmetric, tongue and palate midline midline Motor exam: Right upper extremity has no drift.  She has drift in left upper extremity.  She has drift in bilateral lower extremities. Sensory exam: Diminished to touch on right face and leg and left arm.  Appears to have an inconsistent exam. Coordination: No obvious dysmetria  Imaging independently reviewed: CT head: Chronic changes, aspects 10, no bleed  Impression: 86 year old with multiple cerebrovascular risk factors, on Coumadin, presenting for evaluation of initially chest pain but then possible numbness and weakness which appears more generalized than focal. Given her risk factors, it is appropriate to evaluate her for stroke. She has had in the past, and evaluation for possible TIA which was most likely hypertensive emergency. She was also hypertensive during this presentation making hypertensive urgency/emergency also as a likely presentation. Not a candidate for TPA due to being on Coumadin as well as outside window. Not a candidate for EVT due to baseline mRS  -Evaluate for stroke -Evaluate for hypertensive emergency -Evaluate for toxic metabolic encephalopathy  Recommendations: Obtain MRI of the brain-pursue stroke work-up only for the MRI of the brain is positive for stroke. Check urinalysis and chest  x-ray, CMP Frequent neurochecks Telemetry N.p.o. until passes stroke swallow screen PT OT Check urinalysis, chest x-ray and ammonia levels Check troponins Check twelve-lead EKG BP goal (given both stroke v HTN urgency in differential) - do not treat unless more than 180 SBP on a PRN basis. Avoid sedating meds Can continue anticoagulation unless the MRI shows a large ischemic stroke. CTH negative for hemorrhage. Please call back with questions. Plan relayed to and discussed with the ER resident Dr. Doren Custard and attending Dr. Tamera Punt.  -- Amie Portland, MD [facility] Pager: [phone] If 7pm to 7am, please call on call as listed on AMION.  CRITICAL CARE ATTESTATION Performed by: Amie Portland, MD Total critical care time: 55 minutes Critical care time was exclusive of separately billable procedures and treating other patients and/or supervising APPs/Residents/Students Critical care was necessary to treat or prevent imminent or life-threatening deterioration due to hypertensive urgency, strokelike symptoms, toxic metabolic encephalopathy. This patient is critically ill and at significant risk for neurological worsening and/or death and care requires constant monitoring. Critical care was time spent personally by me on the following activities: development of treatment plan with patient and/or surrogate as well as nursing, discussions with consultants, evaluation of patient's response to treatment, examination of patient, obtaining history from patient or surrogate, ordering and performing treatments and interventions, ordering and review of laboratory studies, ordering and review of radiographic studies, pulse oximetry, re-evaluation of patient's condition, participation in multidisciplinary rounds and medical decision making of high complexity in the care of this patient.

## 2020-01-26 NOTE — ED Notes (Signed)
Pt transported to MRI 

## 2020-01-27 ENCOUNTER — Inpatient Hospital Stay (HOSPITAL_COMMUNITY): Payer: Medicare Other

## 2020-01-27 DIAGNOSIS — Z7901 Long term (current) use of anticoagulants: Secondary | ICD-10-CM

## 2020-01-27 DIAGNOSIS — I482 Chronic atrial fibrillation, unspecified: Secondary | ICD-10-CM

## 2020-01-27 DIAGNOSIS — N184 Chronic kidney disease, stage 4 (severe): Secondary | ICD-10-CM

## 2020-01-27 DIAGNOSIS — I361 Nonrheumatic tricuspid (valve) insufficiency: Secondary | ICD-10-CM

## 2020-01-27 DIAGNOSIS — Z86711 Personal history of pulmonary embolism: Secondary | ICD-10-CM

## 2020-01-27 DIAGNOSIS — I10 Essential (primary) hypertension: Secondary | ICD-10-CM

## 2020-01-27 DIAGNOSIS — R2990 Unspecified symptoms and signs involving the nervous system: Secondary | ICD-10-CM

## 2020-01-27 DIAGNOSIS — I480 Paroxysmal atrial fibrillation: Secondary | ICD-10-CM

## 2020-01-27 DIAGNOSIS — E7800 Pure hypercholesterolemia, unspecified: Secondary | ICD-10-CM

## 2020-01-27 LAB — LIPID PANEL
Cholesterol: 142 mg/dL (ref 0–200)
HDL: 46 mg/dL (ref >40)
LDL Cholesterol: 86 mg/dL (ref 0–99)
Total CHOL/HDL Ratio: 3.1 RATIO
Triglycerides: 52 mg/dL (ref <150)
VLDL: 10 mg/dL (ref 0–40)

## 2020-01-27 LAB — PROTIME-INR
INR: 1.5 — ABNORMAL HIGH (ref 0.8–1.2)
Prothrombin Time: 17.6 seconds — ABNORMAL HIGH (ref 11.4–15.2)

## 2020-01-27 LAB — HEMOGLOBIN A1C
Hgb A1c MFr Bld: 6.2 % — ABNORMAL HIGH (ref 4.8–5.6)
Mean Plasma Glucose: 131.24 mg/dL

## 2020-01-27 LAB — ECHOCARDIOGRAM COMPLETE: Weight: 1982.38 oz

## 2020-01-27 MED ORDER — WARFARIN SODIUM 5 MG PO TABS
5.0000 mg | ORAL_TABLET | Freq: Once | ORAL | Status: AC
  Administered 2020-01-27: 5 mg via ORAL
  Filled 2020-01-27: qty 1

## 2020-01-27 MED ORDER — ACETAMINOPHEN 160 MG/5ML PO SOLN: 650.0000 mg | ORAL | Status: DC | PRN

## 2020-01-27 MED ORDER — WARFARIN - PHARMACIST DOSING INPATIENT: Freq: Every day | Status: DC

## 2020-01-27 MED ORDER — HYDRALAZINE HCL 20 MG/ML IJ SOLN
5.0000 mg | Freq: Four times a day (QID) | INTRAMUSCULAR | Status: DC | PRN
  Administered 2020-01-28: 5 mg via INTRAVENOUS
  Filled 2020-01-27: qty 1

## 2020-01-27 MED ORDER — ACETAMINOPHEN 325 MG PO TABS
650.0000 mg | ORAL_TABLET | ORAL | Status: DC | PRN
  Administered 2020-01-28: 650 mg via ORAL
  Filled 2020-01-27: qty 2

## 2020-01-27 MED ORDER — ATORVASTATIN CALCIUM 40 MG PO TABS
40.0000 mg | ORAL_TABLET | Freq: Every day | ORAL | Status: DC
  Administered 2020-01-27 – 2020-01-29 (×3): 40 mg via ORAL
  Filled 2020-01-27 (×3): qty 1

## 2020-01-27 MED ORDER — SODIUM CHLORIDE 0.9 % IV SOLN: INTRAVENOUS | Status: AC

## 2020-01-27 MED ORDER — ASPIRIN EC 325 MG PO TBEC
325.0000 mg | DELAYED_RELEASE_TABLET | Freq: Every day | ORAL | Status: DC
  Administered 2020-01-28: 325 mg via ORAL
  Filled 2020-01-27 (×2): qty 1

## 2020-01-27 MED ORDER — WARFARIN - PHYSICIAN DOSING INPATIENT: Freq: Every day | Status: DC

## 2020-01-27 MED ORDER — STROKE: EARLY STAGES OF RECOVERY BOOK
Freq: Once | Status: AC
  Filled 2020-01-27: qty 1

## 2020-01-27 MED ORDER — WARFARIN SODIUM 3 MG PO TABS
3.5000 mg | ORAL_TABLET | Freq: Every day | ORAL | Status: DC
  Administered 2020-01-27: 3.5 mg via ORAL
  Filled 2020-01-27 (×2): qty 1

## 2020-01-27 MED ORDER — ACETAMINOPHEN 650 MG RE SUPP: 650.0000 mg | RECTAL | Status: DC | PRN

## 2020-01-27 NOTE — Progress Notes (Signed)
  Echocardiogram 2D Echocardiogram has been performed.  Geoffery Lyons [place] 01/27/2020, 10:14 AM

## 2020-01-27 NOTE — Progress Notes (Addendum)
STROKE TEAM PROGRESS NOTE   INTERVAL HISTORY Her granddaughter ans son are at the bedside.  Pt lethargic but awake alert and orientated. As per family, she is compliant with coumadin, but INR mostly at low levels 1.9-2.2. this admission INR 1.9 and today at 1.5.  Given her CKD, will recommend continue Coumadin but recommend INR goal 2.5-3.0.  OBJECTIVE Vitals:   01/27/20 0200 01/27/20 0322 01/27/20 0400 01/27/20 0803  BP: (!) 171/88 (!) 173/83 (!) 174/135 (!) 168/95  Pulse: 91 67  96  Resp:  18  20  Temp: 98.2 F (36.8 C) 98.3 F (36.8 C) 97.7 F (36.5 C) 98.3 F (36.8 C)  TempSrc: Oral Oral Oral Oral  SpO2:  100%  100%  Weight:        CBC:  Recent Labs  Lab 01/26/20 1628 01/26/20 1629  WBC 4.7  --   NEUTROABS 2.3  --   HGB 13.2 13.9  HCT 41.3 41.0  MCV 107.0*  --   PLT 145*  --     Basic Metabolic Panel:  Recent Labs  Lab 01/26/20 1628 01/26/20 1629  NA 143 143  K 4.3 4.1  CL 104 107  CO2 27  --   GLUCOSE 93 89  BUN 29* 32*  CREATININE 1.97* 1.90*  CALCIUM 9.4  --     Lipid Panel:     Component Value Date/Time   CHOL 142 01/27/2020 0446   TRIG 52 01/27/2020 0446   HDL 46 01/27/2020 0446   CHOLHDL 3.1 01/27/2020 0446   VLDL 10 01/27/2020 0446   LDLCALC 86 01/27/2020 0446   HgbA1c:  Lab Results  Component Value Date   HGBA1C 6.2 (H) 01/27/2020   Urine Drug Screen:     Component Value Date/Time   LABOPIA NONE DETECTED 01/26/2020 2027   COCAINSCRNUR NONE DETECTED 01/26/2020 2027   LABBENZ NONE DETECTED 01/26/2020 2027   AMPHETMU NONE DETECTED 01/26/2020 2027   THCU NONE DETECTED 01/26/2020 2027   LABBARB NONE DETECTED 01/26/2020 2027    Alcohol Level     Component Value Date/Time   ETH <10 01/26/2020 1628    IMAGING  MR BRAIN WO CONTRAST 01/26/2020 IMPRESSION:  1. Acute lacunar type infarct extends from the posterior left corona radiata to the left motor strip in the right upper extremity representation area. No associated hemorrhage or  mass effect.  2. No other acute intracranial abnormality. Underlying chronic small vessel disease otherwise stable to minimally increased since 2019.   CT HEAD CODE STROKE WO CONTRAST 01/26/2020 IMPRESSION:  Stable cerebral white matter disease since 2019. No acute intracranial hemorrhage or cortically based infarct identified. ASPECTS 10.   DG Chest Portable 1 View 01/26/2020 IMPRESSION:  Limited study due to positioning. No acute abnormalities are noted. Mild atelectasis in the left base.  ECG - atrial fibrillation - ventricular response 92 BPM (See cardiology reading for complete details)   PHYSICAL EXAM  Temp:  [97.7 F (36.5 C)-98.7 F (37.1 C)] 98.3 F (36.8 C) (05/23 1200) Pulse Rate:  [60-104] 79 (05/23 1200) Resp:  [13-26] 17 (05/23 1401) BP: (129-182)/(83-136) 141/98 (05/23 1401) SpO2:  [94 %-100 %] 100 % (05/23 1401) Weight:  [56.2 kg] 56.2 kg (05/22 2301)  General - Well nourished, well developed, mildly lethargic.  Ophthalmologic - fundi not visualized due to noncooperation.  Cardiovascular - irregularly irregular heart rate and rhythm  Neuro - awake alert but mildly lethargic.  Eyes open, follows simple commands, paucity of speech however answer questions appropriately.  Orientated to place, people and month but not to year.  Naming 3/4 and able to repeat.  Mild to moderate dysarthria.  Visual field full, no gaze preference, no gaze palsy, PERRL, slight right nasolabial fold flattening.  Tongue midline.  Muscle strength bilaterally symmetrical except right upper extremity mild pronator drift, right lower extremity foot DF 4/5.  Sensation inconsistent on exam, coordination grossly intact bilateral finger-to-nose.  Gait not tested.   ASSESSMENT/PLAN Ms. Adraine Biffle is a 86 y.o. female with history of hypertension, short term memory problems, hx of hypertensive urgency, hx of TIA, hyperlipidemia, atrial fibrillation, DVT 2002, PE 2003, on Coumadin (INR - 1.9)  presenting with LE weakness and chest pain. She did not receive IV t-PA due to unknown time of onset and anticoagulation.    Stroke: Left MCA/ACA small infarct - likely due to A. fib on Coumadin with subtherapeutic INR.  Code Stroke CT Head - Stable cerebral white matter disease since 2019. No acute intracranial hemorrhage or cortically based infarct identified. ASPECTS 10.  MRI head - Acute lacunar type infarct extends from the posterior left corona radiata to the left motor strip in the right upper extremity representation area. Underlying chronic small vessel disease otherwise stable to minimally increased since 2019.   Carotid Doppler unremarkable  2D Echo EF 60 to 65%  [employer] Virus 2 - negative  LDL - 86  HgbA1c - 6.2  UDS - negative  VTE prophylaxis - warfarin  warfarin daily prior to admission, now on aspirin 325 mg daily and warfarin daily.  Recommend INR goal 2.5-3.0.  Continue aspirin 325 as bridge, once INR at goal, aspirin can be discontinued.  Not good candidate for DOAC given CKD 4  Patient counseled to be compliant with her antithrombotic medications  Ongoing aggressive stroke risk factor management  Therapy recommendations: CIR  Disposition:  Pending  Chronic A. Fib  On Coumadin PTA  As per family, INR baseline at the lower end of the goal, commonly at 1.9-2.2  Now recommend increase INR goal to 2.5-3.0  Continue Coumadin but use aspirin 325 as bridge, once INR at goal, aspirin can be discontinued  Hypertension  Home BP meds: none   Current BP meds: hydralazine prn . Permissive hypertension (OK if <180/105) but gradually normalize in 2-3 days  . Long-term BP goal normotensive  Hyperlipidemia  Home Lipid lowering medication:  Lipitor 40 mg daily  LDL 86, goal < 70  Current lipid lowering medication: Lipitor 40 mg daily  Continue statin at discharge  Other Stroke Risk Factors  Advanced age  Hx of TIA in 2019 with unremarkable carotid  Doppler and TTE - followed with Dr. Leonie Man at [facility]  History of DVT in 2002  History of PE in 2003  Other Active Problems  Code status - Full code  CKD stage 4 - cre 1.97->1.90 - not a good candidate for DOAC  Chest pain on admission - troponin negative  Hospital day # 1  Neurology will sign off. Please call with questions. Pt will follow up with stroke clinic NP at [facility] in about 4 weeks. Thanks for the consult.  Rosalin Hawking, MD PhD Stroke Neurology 01/27/2020 3:54 PM   To contact Stroke Continuity provider, please refer to [email or web address] After hours, contact General Neurology

## 2020-01-27 NOTE — Progress Notes (Signed)
VASCULAR LAB PRELIMINARY  PRELIMINARY  PRELIMINARY  PRELIMINARY  Carotid duplex completed.    Preliminary report:  See CV proc for preliminary results.   Christon Gallaway, RVT 01/27/2020, 12:28 PM

## 2020-01-27 NOTE — Progress Notes (Signed)
PROGRESS NOTE    Allison Rhodes  SEG:[id number] DOB: 03-27-1922 DOA: 01/26/2020 PCP: Merrilee Seashore, MD  Brief Narrative: HPI per Dr. Efraín.No[age] y.o. female with PMH of HTN, HLD, a Fib on Warfarin and hx of DVT/PE presented to ED with weakness and admitted for acute lacunar infarct.   History provided by patient and daughter who is at bedside. Reports patient was in her usual state of health this morning. She took a nap this afternoon and when she woke up, she was very weak. Normally she requires minimal assistance to ambulate but her daughter had to basically hold her up and then called EMS. Weakness not localized to either side. Denies facial assymetry, speech difficulty, confusion or other complaints. Patient complained of chest pain when EMS arrived and was given Aspirin. Denies chest pain currently. Also denies headache, dizziness, fever, chills, cough, SOB, abdominal pain, nausea, vomiting, diarrhea, constipation, dysuria, hematuria, hematochezia, melena, speech difficulty, trouble eating, confusion or any other complaints.  In the ED: Hypertensive and intermittently tachycardic otherwise stable on room air. Labs remarkable for glucose 82, INR 1.9. CBC/PTT/Ethanol level WNL. Trop neg x2. Cr 1.97. UDS neg. UA without evidence of infection. Ammonia pending.   CT Head: Stable cerebral white matter disease since 2019. No acute intracranial hemorrhage or cortically based infarct identified.  CXR: Limited study due to positioning. No acute abnormalities are noted. Mild atelectasis in the left base. MRI Brain:  1. Acute lacunar type infarct extends from the posterior left corona radiata to the left motor strip in the right upper extremity representation area. No associated hemorrhage or mass effect.  2. No other acute intracranial abnormality. Underlying chronic small vessel disease otherwise stable to minimally increased since 2019.  Neuro consulted and gave recommendation upon  admission.   Assessment & Plan:   Principal Problem:   Lacunar infarct, acute (Regino Ramirez) Active Problems:   Hypertension   Hyperlipemia   History of pulmonary embolism   CKD (chronic kidney disease)   Atrial fibrillation, chronic (HCC)   Chronic anticoagulation   Hypertensive urgency    #1 acute left lacunar infarct- Secondary to uncontrolled hypertension. MRI of the brain-acute lacunar type infarct extending from the posterior left coronary radiator to the left motor strip in the right upper extremity representation area.  No hemorrhage or mass-effect.  Underlying chronic small vessel disease stable to minimally increased since 2019. Echocardiogram-Left ventricular ejection fraction, by estimation, is 60 to 65%. The  left ventricle has normal function. Left ventricular endocardial border  not optimally defined to evaluate regional wall motion. Left ventricular  diastolic parameters are  indeterminate.  2. Right ventricular systolic function is low normal. The right  ventricular size is mildly enlarged. There is normal pulmonary artery  systolic pressure.  3. Right atrial size was mild to moderately dilated.  4. The mitral valve is degenerative. No evidence of mitral valve  regurgitation.  5. Tricuspid valve regurgitation is moderate.  6. The aortic valve was not well visualized. Aortic valve regurgitation  is not visualized.  7. The inferior vena cava is normal in size with greater than 50%  respiratory variability, suggesting right atrial pressure of 3 mmHg Vascular ultrasound pending Chest x-ray mild left basilar atelectasis. PT recommends CIR On Coumadin prior to admission, on aspirin since admission INR 1.5 ldl 86 hbaic 6.2 Ammonia 18 Trop negative uds negative ua protinuria  Discussed with Dr. Erlinda Hong recommending INR between 2.5-3 Continue aspirin 325 mg till INR achieved at 2.5-3 then DC aspirin.  #2 essential  hypertension on hydralazine 5 mg every 6 as needed.   Blood pressure 141/98  #3 chronic atrial fibrillation on Coumadin  #4 DVT and PE on Coumadin  #5 CKD stage IIIb stable  #6 hyperlipidemia on Lipitor  Estimated body mass index is 21.27 kg/m as calculated from the following:   Height as of 05/16/18: 5\' 4"  (1.626 m).   Weight as of this encounter: 56.2 kg.  DVT prophylaxis: Coumadin Code Status: Full code Family Communication: Called patient's daughter with no response Disposition Plan:  Status is: Inpatient   Dispo: The patient is from: Home              Anticipated d/c is to: CIR              Anticipated d/c date is: Unknown              Patient currently is not medically stable to d/c.  Patient admitted with acute stroke PT recommending CIR    Consultants: neuro Procedures: none Antimicrobials: none Subjective:  Follows commands needs help feeding Moves all extremities Objective: Vitals:   01/27/20 0400 01/27/20 0803 01/27/20 1200 01/27/20 1401  BP: (!) 174/135 (!) 168/95 (!) 129/95 (!) 141/98  Pulse:  96 79   Resp:  20 20 17   Temp: 97.7 F (36.5 C) 98.3 F (36.8 C) 98.3 F (36.8 C)   TempSrc: Oral Oral Oral   SpO2:  100% 99% 100%  Weight:        Intake/Output Summary (Last 24 hours) at 01/27/2020 1459 Last data filed at 01/27/2020 0545 Gross per 24 hour  Intake 1204.41 ml  Output --  Net 1204.41 ml   Filed Weights   01/26/20 2301  Weight: 56.2 kg    Examination:  General exam: Appears calm and comfortable  Respiratory system: Clear to auscultation. Respiratory effort normal. Cardiovascular system: S1 & S2 heard, RRR. No JVD, murmurs, rubs, gallops or clicks. No pedal edema. Gastrointestinal system: Abdomen is nondistended, soft and nontender. No organomegaly or masses felt. Normal bowel sounds heard. Central nervous system: Alert and oriented. No focal neurological deficits. Extremities: Symmetric 5 x 5 power. Skin: No rashes, lesions or ulcers Psychiatry: Judgement and insight appear normal. Mood  & affect appropriate.     Data Reviewed: I have personally reviewed following labs and imaging studies  CBC: Recent Labs  Lab 01/26/20 1628 01/26/20 1629  WBC 4.7  --   NEUTROABS 2.3  --   HGB 13.2 13.9  HCT 41.3 41.0  MCV 107.0*  --   PLT 145*  --    Basic Metabolic Panel: Recent Labs  Lab 01/26/20 1628 01/26/20 1629  NA 143 143  K 4.3 4.1  CL 104 107  CO2 27  --   GLUCOSE 93 89  BUN 29* 32*  CREATININE 1.97* 1.90*  CALCIUM 9.4  --    GFR: CrCl cannot be calculated (Unknown ideal weight.). Liver Function Tests: Recent Labs  Lab 01/26/20 1628  AST 31  ALT 23  ALKPHOS 47  BILITOT 1.1  PROT 7.4  ALBUMIN 3.9   No results for input(s): LIPASE, AMYLASE in the last 168 hours. Recent Labs  Lab 01/26/20 2108  AMMONIA 18   Coagulation Profile: Recent Labs  Lab 01/26/20 1628 01/27/20 0446  INR 1.9* 1.5*   Cardiac Enzymes: No results for input(s): CKTOTAL, CKMB, CKMBINDEX, TROPONINI in the last 168 hours. BNP (last 3 results) No results for input(s): PROBNP in the last 8760 hours. HbA1C: Recent Labs  01/27/20 0446  HGBA1C 6.2*   CBG: Recent Labs  Lab 01/26/20 1619  GLUCAP 82   Lipid Profile: Recent Labs    01/27/20 0446  CHOL 142  HDL 46  LDLCALC 86  TRIG 52  CHOLHDL 3.1   Thyroid Function Tests: No results for input(s): TSH, T4TOTAL, FREET4, T3FREE, THYROIDAB in the last 72 hours. Anemia Panel: No results for input(s): VITAMINB12, FOLATE, FERRITIN, TIBC, IRON, RETICCTPCT in the last 72 hours. Sepsis Labs: No results for input(s): PROCALCITON, LATICACIDVEN in the last 168 hours.  Recent Results (from the past 240 hour(s))  SARS Coronavirus 2 by RT PCR (hospital order, performed in [facility] hospital lab) Nasopharyngeal Nasopharyngeal Swab     Status: None   Collection Time: 01/26/20 10:29 PM   Specimen: Nasopharyngeal Swab  Result Value Ref Range Status   SARS Coronavirus 2 NEGATIVE NEGATIVE Final    Comment: (NOTE) SARS-CoV-2  target nucleic acids are NOT DETECTED. The SARS-CoV-2 RNA is generally detectable in upper and lower respiratory specimens during the acute phase of infection. The lowest concentration of SARS-CoV-2 viral copies this assay can detect is 250 copies / mL. A negative result does not preclude SARS-CoV-2 infection and should not be used as the sole basis for treatment or other patient management decisions.  A negative result may occur with improper specimen collection / handling, submission of specimen other than nasopharyngeal swab, presence of viral mutation(s) within the areas targeted by this assay, and inadequate number of viral copies (<250 copies / mL). A negative result must be combined with clinical observations, patient history, and epidemiological information. Fact Sheet for Patients:   StrictlyIdeas.no Fact Sheet for Healthcare Providers: BankingDealers.co.za This test is not yet approved or cleared  by the [place] FDA and has been authorized for detection and/or diagnosis of SARS-CoV-2 by FDA under an Emergency Use Authorization (EUA).  This EUA will remain in effect (meaning this test can be used) for the duration of the COVID-19 declaration under Section 564(b)(1) of the Act, 21 U.S.C. section 360bbb-3(b)(1), unless the authorization is terminated or revoked sooner. Performed at [facility], [place] [address], [place], [place] [address]          Radiology Studies: MR BRAIN WO CONTRAST  Result Date: 01/26/2020 CLINICAL DATA:  86 year old female code stroke presentation with left side weakness. EXAM: MRI HEAD WITHOUT CONTRAST TECHNIQUE: Multiplanar, multiecho pulse sequences of the brain and surrounding structures were obtained without intravenous contrast. COMPARISON:  Head CT earlier today. Brain MRI 03/31/2018. FINDINGS: Brain: 18 mm somewhat linear area of restricted diffusion tracking from the posterior left  corona radiata to the left motor strip near the right upper extremity representation area (series 5, image 46 and series 7, image 86). Faint associated T2 and FLAIR hyperintensity with no hemorrhage or mass effect. No other convincing restricted diffusion. Chronic microhemorrhages in the bilateral deep gray nuclei especially the right thalamus with some associated DWI artifact. Small chronic infarct in the left cerebellum is more apparent than in 2019 on series 10, image 5. Confluent bilateral white matter T2 and FLAIR hyperintensity appears stable. No definite chronic cortical encephalomalacia. No midline shift, mass effect, evidence of mass lesion, ventriculomegaly, extra-axial collection or acute intracranial hemorrhage. Cervicomedullary junction and pituitary are within normal limits. Vascular: Major intracranial vascular flow voids are stable since 2019. Skull and upper cervical spine: Multilevel chronic cervical spine degeneration, up to mild associated degenerative spinal stenosis (series 9, image 13). But bone marrow signal appears normal. Sinuses/Orbits:  Stable and negative. Other: Mastoids remain clear. Visible internal auditory structures appear normal. IMPRESSION: 1. Acute lacunar type infarct extends from the posterior left corona radiata to the left motor strip in the right upper extremity representation area. No associated hemorrhage or mass effect. 2. No other acute intracranial abnormality. Underlying chronic small vessel disease otherwise stable to minimally increased since 2019. Electronically Signed   By: Genevie Ann M.D.   On: 01/26/2020 18:45   DG Chest Portable 1 View  Result Date: 01/26/2020 CLINICAL DATA:  Chest pain EXAM: PORTABLE CHEST 1 VIEW COMPARISON:  March 31, 2018 FINDINGS: The study is limited due to patient rotation. Stable cardiomegaly. The hila and mediastinum are unremarkable. No pneumothorax. No nodules or masses. Mild atelectasis in the left base. No other acute abnormalities.  IMPRESSION: Limited study due to positioning. No acute abnormalities are noted. Mild atelectasis in the left base. Electronically Signed   By: Dorise Bullion III M.D   On: 01/26/2020 17:14   ECHOCARDIOGRAM COMPLETE  Result Date: 01/27/2020    ECHOCARDIOGRAM REPORT   Patient Name:   Allison Rhodes Date of Exam: 01/27/2020 Medical Rec #:  [id number]         Height:       64.0 in Accession #:    [id number]        Weight:       123.9 lb Date of Birth:  01-15-1922         BSA:          1.596 m Patient Age:    86 years          BP:           174/135 mmHg Patient Gender: F                 HR:           90 bpm. Exam Location:  Inpatient Procedure: 2D Echo, Cardiac Doppler and Color Doppler Indications:    Stroke 434.91 / I163.9  History:        Patient has prior history of Echocardiogram examinations, most                 recent 04/01/2018. TIA, Arrythmias:Atrial Fibrillation; Risk                 Factors:Hypertension, Dyslipidemia and Non-Smoker.  Sonographer:    Vickie Epley RDCS Referring Phys: [id number] ASHISH ARORA IMPRESSIONS  1. Left ventricular ejection fraction, by estimation, is 60 to 65%. The left ventricle has normal function. Left ventricular endocardial border not optimally defined to evaluate regional wall motion. Left ventricular diastolic parameters are indeterminate.  2. Right ventricular systolic function is low normal. The right ventricular size is mildly enlarged. There is normal pulmonary artery systolic pressure.  3. Right atrial size was mild to moderately dilated.  4. The mitral valve is degenerative. No evidence of mitral valve regurgitation.  5. Tricuspid valve regurgitation is moderate.  6. The aortic valve was not well visualized. Aortic valve regurgitation is not visualized.  7. The inferior vena cava is normal in size with greater than 50% respiratory variability, suggesting right atrial pressure of 3 mmHg. FINDINGS  Left Ventricle: Left ventricular ejection fraction, by estimation, is 60 to  65%. The left ventricle has normal function. Left ventricular endocardial border not optimally defined to evaluate regional wall motion. The left ventricular internal cavity size was normal in size. There is no left ventricular hypertrophy. Left ventricular diastolic parameters are indeterminate. Right  Ventricle: The right ventricular size is mildly enlarged. Right vetricular wall thickness was not assessed. Right ventricular systolic function is low normal. There is normal pulmonary artery systolic pressure. The tricuspid regurgitant velocity is  2.84 m/s, and with an assumed right atrial pressure of 3 mmHg, the estimated right ventricular systolic pressure is 65.7 mmHg. Left Atrium: Left atrial size was normal in size. Right Atrium: Right atrial size was mild to moderately dilated. Pericardium: There is no evidence of pericardial effusion. Mitral Valve: The mitral valve is degenerative in appearance. There is mild thickening of the mitral valve leaflet(s). No evidence of mitral valve regurgitation. Tricuspid Valve: The tricuspid valve is grossly normal. Tricuspid valve regurgitation is moderate. Aortic Valve: The aortic valve was not well visualized. Aortic valve regurgitation is not visualized. Pulmonic Valve: The pulmonic valve was not well visualized. Pulmonic valve regurgitation is not visualized. Aorta: The aortic root is normal in size and structure. Venous: The inferior vena cava is normal in size with greater than 50% respiratory variability, suggesting right atrial pressure of 3 mmHg. IAS/Shunts: No atrial level shunt detected by color flow Doppler.  LEFT VENTRICLE PLAX 2D LVIDd:         3.00 cm LVIDs:         2.30 cm LV PW:         0.80 cm LV IVS:        0.80 cm LVOT diam:     1.90 cm LV SV:         57 LV SV Index:   36 LVOT Area:     2.84 cm  RIGHT VENTRICLE TAPSE (M-mode): 1.6 cm LEFT ATRIUM             Index       RIGHT ATRIUM           Index LA diam:        3.00 cm 1.88 cm/m  RA Area:     18.00 cm  LA Vol (A2C):   41.0 ml 25.69 ml/m RA Volume:   54.80 ml  34.33 ml/m LA Vol (A4C):   34.3 ml 21.49 ml/m LA Biplane Vol: 40.0 ml 25.06 ml/m  AORTIC VALVE LVOT Vmax:   121.00 cm/s LVOT Vmean:  67.900 cm/s LVOT VTI:    0.200 m  AORTA Ao Root diam: 2.80 cm TRICUSPID VALVE TR Peak grad:   32.3 mmHg TR Vmax:        284.00 cm/s  SHUNTS Systemic VTI:  0.20 m Systemic Diam: 1.90 cm Kate Sable MD Electronically signed by Kate Sable MD Signature Date/Time: 01/27/2020/2:10:13 PM    Final    CT HEAD CODE STROKE WO CONTRAST  Result Date: 01/26/2020 CLINICAL DATA:  Code stroke. 86 year old female with left side weakness. Last seen normal noon. EXAM: CT HEAD WITHOUT CONTRAST TECHNIQUE: Contiguous axial images were obtained from the base of the skull through the vertex without intravenous contrast. COMPARISON:  Head CT and brain MRI 03/31/2018. FINDINGS: Brain: Stable cerebral volume since 2019. Stable ventricle size and configuration. No midline shift, mass effect, or evidence of intracranial mass lesion. Chronic dystrophic basal ganglia calcifications. No acute intracranial hemorrhage identified. Patchy and confluent bilateral cerebral white matter hypodensity. Stable gray-white matter differentiation throughout the brain. No cortically based acute infarct identified. Vascular: Calcified atherosclerosis at the skull base. No suspicious intracranial vascular hyperdensity. Skull: Stable. Hyperostosis of the calvarium. Sinuses/Orbits: Visualized paranasal sinuses and mastoids are stable and well pneumatized. Other: No acute orbit or scalp soft tissue finding.  ASPECTS [facility] Stroke Program Early CT Score) Total score (0-10 with 10 being normal): 10 IMPRESSION: 1. Stable cerebral white matter disease since 2019. No acute intracranial hemorrhage or cortically based infarct identified. ASPECTS 10. 2. These results were communicated to Dr. Rory Percy at 4:42 pmon 5/22/2021by text page via the [facility] messaging system.  Electronically Signed   By: Genevie Ann M.D.   On: 01/26/2020 16:42   VAS US CAROTID  Result Date: 01/27/2020 Carotid Arterial Duplex Study Indications:       CVA and Weakness. Risk Factors:      Hypertension, hyperlipidemia. Other Factors:     Permanent atrial fibrillation, on Warfarin. Comparison Study:  Prior study from 03/31/18 is available for comparison Performing Technologist: Sharion Dove RVS  Examination Guidelines: A complete evaluation includes B-mode imaging, spectral Doppler, color Doppler, and power Doppler as needed of all accessible portions of each vessel. Bilateral testing is considered an integral part of a complete examination. Limited examinations for reoccurring indications may be performed as noted.  Right Carotid Findings: +----------+--------+--------+--------+------------------+------------------+           PSV cm/sEDV cm/sStenosisPlaque DescriptionComments           +----------+--------+--------+--------+------------------+------------------+ CCA Prox  76      12                                intimal thickening +----------+--------+--------+--------+------------------+------------------+ CCA Distal45      12                                intimal thickening +----------+--------+--------+--------+------------------+------------------+ ICA Prox  31      9               homogeneous       tortuous           +----------+--------+--------+--------+------------------+------------------+ ICA Distal78      15                                tortuous           +----------+--------+--------+--------+------------------+------------------+ ECA       53      18                                                   +----------+--------+--------+--------+------------------+------------------+ +----------+--------+-------+--------+-------------------+           PSV cm/sEDV cmsDescribeArm Pressure (mmHG) +----------+--------+-------+--------+-------------------+  Subclavian29                                         +----------+--------+-------+--------+-------------------+ +---------+--------+--+--------+--+ VertebralPSV cm/s46EDV cm/s10 +---------+--------+--+--------+--+  Left Carotid Findings: +----------+--------+--------+--------+------------------+------------------+           PSV cm/sEDV cm/sStenosisPlaque DescriptionComments           +----------+--------+--------+--------+------------------+------------------+ CCA Prox  51      8                                 intimal thickening +----------+--------+--------+--------+------------------+------------------+ CCA Distal50      9  homogeneous                          +----------+--------+--------+--------+------------------+------------------+ ICA Prox  30      9               focal and calcific                   +----------+--------+--------+--------+------------------+------------------+ ICA Distal40      13                                                   +----------+--------+--------+--------+------------------+------------------+ ECA       45      9                                                    +----------+--------+--------+--------+------------------+------------------+ +----------+--------+--------+--------+-------------------+           PSV cm/sEDV cm/sDescribeArm Pressure (mmHG) +----------+--------+--------+--------+-------------------+ [id number]                                          +----------+--------+--------+--------+-------------------+ +---------+--------+--+--------+-+ VertebralPSV cm/s29EDV cm/s6 +---------+--------+--+--------+-+     Preliminary         Scheduled Meds: . aspirin EC  81 mg Oral Daily  . atorvastatin  40 mg Oral Daily  . warfarin  3.5 mg Oral q1600  . Warfarin - Physician Dosing Inpatient   Does not apply q1600   Continuous Infusions:   LOS: 1 day     Georgette Shell,  MD 01/27/2020, 2:59 PM

## 2020-01-27 NOTE — Progress Notes (Signed)
Inpatient Rehab Admissions:  Inpatient Rehab Consult received.  I met with pt and daughter, [name] at the bedside for rehabilitation assessment and to discuss goals and expectations of an inpatient rehab admission.  Pt appears to be a good candidate for CIR.  However, daughter not sure she wants to pursue CIR vs. HH.  Stated she wanted to talk it over with her husband and then make a decision.  Pt has lived with daughter and son-in-law for 53 years.  She mentioned that she may feel more comfortable with [facility] therapy d/t COVID pandemic.  Daughter indicated that she would inform [facility] when she and husband made a decision regarding further therapy for pt.  Signed: Gayland Curry, [place], [place] Admissions Coordinator [phone]

## 2020-01-27 NOTE — Evaluation (Signed)
Speech Language Pathology Evaluation Patient Details Name: Allison Rhodes MRN: [id number] DOB: Mar 21, 1922 Today's Date: 01/27/2020 Time: 1430-1500 SLP Time Calculation (min) (ACUTE ONLY): 30 min  Problem List:  Patient Active Problem List   Diagnosis Date Noted  . Lacunar infarct, acute ([place]) 01/26/2020  . Atrial fibrillation, chronic ([place]) 01/26/2020  . Chronic anticoagulation 01/26/2020  . Hypertensive urgency 01/26/2020  . TIA (transient ischemic attack) 03/31/2018  . Hypertension 03/31/2018  . Hyperlipemia 03/31/2018  . History of pulmonary embolism 03/31/2018  . CKD (chronic kidney disease) 03/31/2018   Past Medical History:  Past Medical History:  Diagnosis Date  . Arthritis    "qwhere" (03/31/2018)  . Chronic atrial fibrillation ([place])   . CKD (chronic kidney disease), stage III   . DVT (deep venous thrombosis) ([place]) 2002   ?LLE  . Heart murmur   . Hyperlipidemia   . Hypertension   . Osteoporosis   . Pneumonia 2018  . Pulmonary embolism ([place]) 2003  . Stroke [facility]) 03/31/2018   "unsteady on her feet" (03/31/2018)   Past Surgical History:  Past Surgical History:  Procedure Laterality Date  . CATARACT EXTRACTION W/ INTRAOCULAR LENS  IMPLANT, BILATERAL Bilateral    HPI:  Pt is a 86 y.o. female with PMHx including HTN, HLD, a fib on Warfarin, hx of DVT/PE who presented to ED with weakness. MRI brain revealed left corona radiata infarct.   Assessment / Plan / Recommendation Clinical Impression   Pt presents with moderate cognitive deficits characterized by decreased sustained attention to tasks, decreased functional problem solving, and decreased recall of daily information.  Pt's daughter was present throughout today's evaluation and reported that pt seems slower to respond and "foggy" but is fairly close to her baseline.  Pt would benefit from skilled ST while inpatient in order to maximize functional independence and reduce burden of care prior to discharge.      SLP Assessment  SLP Recommendation/Assessment: Patient needs continued Speech Lanaguage Pathology Services SLP Visit Diagnosis: Cognitive communication deficit (R41.841)    Follow Up Recommendations  Home health SLP;24 hour supervision/assistance    Frequency and Duration min 1 x/week         SLP Evaluation Cognition  Overall Cognitive Status: Impaired/Different from baseline Arousal/Alertness: Awake/alert Orientation Level: Oriented X4 Attention: Sustained Sustained Attention: Impaired Sustained Attention Impairment: Functional basic;Verbal basic Memory: Impaired Memory Impairment: Storage deficit;Retrieval deficit;Decreased recall of new information Problem Solving: Impaired Problem Solving Impairment: Verbal basic;Functional basic Safety/Judgment: Impaired       Comprehension  Auditory Comprehension Overall Auditory Comprehension: Appears within functional limits for tasks assessed    Expression Expression Primary Mode of Expression: Verbal Verbal Expression Overall Verbal Expression: Appears within functional limits for tasks assessed Written Expression Dominant Hand: Right   Oral / Motor  Oral Motor/Sensory Function Overall Oral Motor/Sensory Function: Within functional limits Motor Speech Overall Motor Speech: Appears within functional limits for tasks assessed   GO                    Emilio Math 01/27/2020, 3:07 PM

## 2020-01-27 NOTE — Progress Notes (Signed)
ANTICOAGULATION CONSULT NOTE - Initial Consult  Pharmacy Consult for warfarin Indication: atrial fibrillation  No Known Allergies  Patient Measurements: Weight: 56.2 kg (123 lb 14.4 oz)  Vital Signs: Temp: 98.3 F (36.8 C) (05/23 1200) Temp Source: Oral (05/23 1200) BP: 141/98 (05/23 1401) Pulse Rate: 79 (05/23 1200)  Labs: Recent Labs    01/26/20 1628 01/26/20 1629 01/26/20 2016 01/27/20 0446  HGB 13.2 13.9  --   --   HCT 41.3 41.0  --   --   PLT 145*  --   --   --   APTT 31  --   --   --   LABPROT 20.7*  --   --  17.6*  INR 1.9*  --   --  1.5*  CREATININE 1.97* 1.90*  --   --   TROPONINIHS 17  --  16  --     CrCl cannot be calculated (Unknown ideal weight.).  Assessment: CC/HPI: CVA  PMH: HTN, HLD, Afib, hx DVT 2002/PE 2003  Anticoag: Warfarin PTA for afib; INR 1.9 >1.5. Aspirin 325 for bridging   PTA warfarin: 3.5 mg daily   Neuro: 5/22 Acute CVA, no hemorrhage   Renal: Scr 1.9 (BL 1.8);  Heme/Onc: H/H, plt stable  Goal of Therapy:  INR 2.5 - 3 per MD Monitor platelets by anticoagulation protocol: Yes   Plan:  Warfarin 5 mg x 1 Daily INR DC aspirin when INR at goal   Barth Kirks, PharmD, BCPS, BCCCP Clinical Pharmacist [phone]  Please check AMION for all [place] numbers  01/27/2020 3:26 PM

## 2020-01-27 NOTE — Plan of Care (Signed)
MRI brain - left corona radiata infarct. No bleed or petechial hemorrhage. Atrophy and small vessel disease. Stroke work up should be pursued. Continue warfarin as stroke is small. Stroke team will follow. -- Amie Portland, MD [facility]

## 2020-01-27 NOTE — Evaluation (Signed)
Physical Therapy Evaluation Patient Details Name: Allison Rhodes MRN: [id number] DOB: Sep 16, 1921 Today's Date: 01/27/2020   History of Present Illness  Pt is a 86 y.o. female with PMHx including HTN, HLD, a fib on Warfarin, hx of DVT/PE who presented to ED with weakness. MRI brain revealed left corona radiata infarct.     Clinical Impression  Pt presents to PT with significant mobility deficits due to weakness, fatigue, motor control deficits, cognitive deficits resulting in dependencies for moving around and changing positions. Will benefit from PT in the postacute setting to address deficits with goal of restoring ambulatory function for decreased caregiver burden and increased functional independence similar to previous levels.  Recommend CIR for consult to determine if pt appropriate for setting. PT will initiate care in acute setting and assist with disposition.    Follow Up Recommendations CIR    Equipment Recommendations  None recommended by PT    Recommendations for Other Services       Precautions / Restrictions Precautions Precautions: Fall Restrictions Weight Bearing Restrictions: No      Mobility  Bed Mobility Overal bed mobility: Needs Assistance Bed Mobility: Supine to Sit;Sit to Supine;Rolling Rolling: Max assist   Supine to sit: Max assist;+2 for physical assistance Sit to supine: Max assist;+2 for physical assistance   General bed mobility comments: pt able to initiate motion but ultimately requiring assist to fully achieve LEs over EOB and to elevate trunk. Rolling to L/R end of session for straightening/chaning bed pads   Transfers Overall transfer level: Needs assistance Equipment used: 2 person hand held assist Transfers: Sit to/from Stand Sit to Stand: Mod assist;+2 physical assistance         General transfer comment: Provided front guard technique and pt needs assist to lift off EOB and is not able to fully extend trunk or legs; daughter notes  "this is her usual way to stand" but unclear she needed this level of assist previously  Ambulation/Gait             General Gait Details: deferred, pt fatigued at EOB with sitting activities and BP increased to over 100 DBP  Stairs            Wheelchair Mobility    Modified Rankin (Stroke Patients Only) Modified Rankin (Stroke Patients Only) Pre-Morbid Rankin Score: Moderate disability Modified Rankin: Severe disability     Balance Overall balance assessment: Needs assistance Sitting-balance support: Feet supported;No upper extremity supported Sitting balance-Leahy Scale: Poor Sitting balance - Comments: tends to lean right/posterior and perceives this "I feel like I might fall" but unable to assume sitting upright unassisted and unable to maintain for more than 30 seconds or so when assisted to neutral.   Postural control: Posterior lean;Right lateral lean Standing balance support: Bilateral upper extremity supported;During functional activity Standing balance-Leahy Scale: Zero Standing balance comment: pt unable to come fully to standing with legs off EOB even with 2 person assist suspect due to fatigue                             Pertinent Vitals/Pain Pain Assessment: No/denies pain    Home Living Family/patient expects to be discharged to:: Private residence Living Arrangements: Children Available Help at Discharge: Family;Available 24 hours/day Type of Home: House Home Access: Stairs to enter   [employer] of Steps: 1 Home Layout: Two level;Able to live on main level with bedroom/bathroom Home Equipment: Kasandra Knudsen - single point;Shower seat;Walker -  2 wheels      Prior Function Level of Independence: Needs assistance   Gait / Transfers Assistance Needed: using SPC for household mobility  ADL's / Homemaking Assistance Needed: daughter occasionally helps with ADL tasks, always provides supervision for showers, occasional assistance for  dressing, typically performing toileting needs on her own         Hand Dominance   Dominant Hand: Right    Extremity/Trunk Assessment   Upper Extremity Assessment Upper Extremity Assessment: Defer to OT evaluation RUE Deficits / Details: grossly 3-/5 throughout RUE Coordination: decreased gross motor LUE Deficits / Details: pt with baseline rotator cuff tear, not able to raise arm beyond grossly 30* LUE Coordination: decreased gross motor    Lower Extremity Assessment Lower Extremity Assessment: Generalized weakness    Cervical / Trunk Assessment Cervical / Trunk Assessment: Kyphotic  Communication   Communication: Expressive difficulties;HOH  Cognition Arousal/Alertness: Awake/alert Behavior During Therapy: WFL for tasks assessed/performed Overall Cognitive Status: Impaired/Different from baseline Area of Impairment: Attention;Memory;Following commands;Awareness;Problem solving                   Current Attention Level: Sustained Memory: Decreased recall of precautions;Decreased short-term memory Following Commands: Follows one step commands with increased time;Follows one step commands inconsistently   Awareness: Emergent Problem Solving: Slow processing;Decreased initiation;Difficulty sequencing;Requires verbal cues;Requires tactile cues General Comments: pt with delayed processing, soft spoken and often requiring repetition of instruction/questions (pt also HOH). pt was aware that she was leaning/drifting towards one side when sitting EOB       General Comments General comments (skin integrity, edema, etc.): Noting increase in BP with SPB in acceptable range <180 but DBP above 100 in sitting and after return to sitting. RN aware. Family very supportive and pt is delightful!    Exercises     Assessment/Plan    PT Assessment Patient needs continued PT services  PT Problem List Decreased strength;Decreased range of motion;Decreased activity  tolerance;Decreased balance;Decreased mobility;Decreased coordination;Decreased cognition;Decreased knowledge of use of DME;Decreased safety awareness;Decreased knowledge of precautions;Cardiopulmonary status limiting activity       PT Treatment Interventions DME instruction;Gait training;Functional mobility training;Therapeutic activities;Balance training;Therapeutic exercise;Neuromuscular re-education;Cognitive remediation;Patient/family education    PT Goals (Current goals can be found in the Care Plan section)  Acute Rehab PT Goals Patient Stated Goal: per family, to increase her mobility prior to return home PT Goal Formulation: With family Time For Goal Achievement: 02/17/20 Potential to Achieve Goals: Good    Frequency Min 4X/week   Barriers to discharge   family ready to care for pt at home, needs her to be ambulatory to do so    Co-evaluation PT/OT/SLP Co-Evaluation/Treatment: Yes Reason for Co-Treatment: Necessary to address cognition/behavior during functional activity;For patient/therapist safety;To address functional/ADL transfers PT goals addressed during session: Mobility/safety with mobility;Balance OT goals addressed during session: ADL's and self-care       AM-PAC PT "6 Clicks" Mobility  Outcome Measure Help needed turning from your back to your side while in a flat bed without using bedrails?: A Lot Help needed moving from lying on your back to sitting on the side of a flat bed without using bedrails?: A Lot Help needed moving to and from a bed to a chair (including a wheelchair)?: A Lot Help needed standing up from a chair using your arms (e.g., wheelchair or bedside chair)?: A Lot Help needed to walk in hospital room?: Total Help needed climbing 3-5 steps with a railing? : Total 6 Click Score: 10  End of Session   Activity Tolerance: Patient limited by fatigue Patient left: in bed;with call bell/phone within reach;with family/visitor present Nurse  Communication: Mobility status PT Visit Diagnosis: Muscle weakness (generalized) (M62.81);Other symptoms and signs involving the nervous system (R29.898);Other abnormalities of gait and mobility (R26.89)    Time: 1694-5038 PT Time Calculation (min) (ACUTE ONLY): 28 min   Charges:   PT Evaluation $PT Eval Moderate Complexity: 1 Mod          Kearney Hard, PT, DPT, MS Board Certified Geriatric Clinical Specialist   Herbie Drape 01/27/2020, 3:04 PM

## 2020-01-27 NOTE — Evaluation (Signed)
Occupational Therapy Evaluation Patient Details Name: Allison Rhodes MRN: [id number] DOB: 10-26-1921 Today's Date: 01/27/2020    History of Present Illness Pt is a 86 y.o. female with PMHx including HTN, HLD, a fib on Warfarin, hx of DVT/PE who presented to ED with weakness. MRI brain revealed left corona radiata infarct.    Clinical Impression   This 86 y/o female presents with the above. PTA pt living at home with family, was performing functional mobility using SPC (occasional assistance from daughter) and receiving intermittent assist for ADL tasks (including supervision for showers). Pt presents supine in bed pleasant and agreeable to working with therapies. Currently pt requiring two person assist for safe completion of bed mobility and sit<>stand transfers at EOB. Pt with fluctuating levels of assist for static sitting balance, requiring up to modA when fatigued. She currently requires maxA for LB ADL and up to [facility] for UB ADL. Pt with very supportive family present during session. She will benefit from continued acute OT services and feel she may benefit from CIR level therapies at time of discharge to progress pt's overall safety and independence with ADL and mobility and to reduce level of caregiver burden prior to return home. Will follow.     Follow Up Recommendations  CIR;Supervision/Assistance - 24 hour    Equipment Recommendations  Other (comment)(TBD)    Recommendations for Other Services Rehab consult     Precautions / Restrictions Precautions Precautions: Fall Restrictions Weight Bearing Restrictions: No      Mobility Bed Mobility Overal bed mobility: Needs Assistance Bed Mobility: Supine to Sit;Sit to Supine;Rolling Rolling: Max assist   Supine to sit: Max assist;+2 for physical assistance Sit to supine: Max assist;+2 for physical assistance   General bed mobility comments: pt able to initiate motion but ultimately requiring assist to fully achieve LEs over  EOB and to elevate trunk. Rolling to L/R end of session for straightening/chaning bed pads   Transfers Overall transfer level: Needs assistance Equipment used: 2 person hand held assist Transfers: Sit to/from Stand Sit to Stand: Mod assist;+2 physical assistance         General transfer comment: pt requiring boosting assist and to stabilize in standing. Assist to faciliate/promote upright posture as pt tending to maintain forward flexion. pt fatigues quickly and not able to tolerate further mobility today     Balance Overall balance assessment: Needs assistance Sitting-balance support: Feet supported Sitting balance-Leahy Scale: Poor Sitting balance - Comments: flucuating levels of assist for static balance, able to maintain for brief periods at minguard assist but requiring up to modA when fatigued. Posterior and R lateral lean noted  Postural control: Posterior lean;Right lateral lean Standing balance support: Bilateral upper extremity supported Standing balance-Leahy Scale: Poor Standing balance comment: +2 external assist                            ADL either performed or assessed with clinical judgement   ADL Overall ADL's : Needs assistance/impaired Eating/Feeding: Set up;Supervision/ safety;Sitting   Grooming: Minimal assistance;Sitting   Upper Body Bathing: Moderate assistance;Sitting   Lower Body Bathing: Maximal assistance;Sitting/lateral leans;Sit to/from stand   Upper Body Dressing : Moderate assistance;Sitting   Lower Body Dressing: Maximal assistance;+2 for physical assistance;Sit to/from stand       Toileting- Water quality scientist and Hygiene: Total assistance;Sit to/from stand;Sitting/lateral lean       Functional mobility during ADLs: Moderate assistance;+2 for physical assistance(sit<>stand) General ADL Comments: pt with weakness,  decreased activity tolerance, impaired cognition     Vision   Additional Comments: not formally assessed -  will continue to assess in functional context      Perception     Praxis      Pertinent Vitals/Pain Pain Assessment: No/denies pain     Hand Dominance Right   Extremity/Trunk Assessment Upper Extremity Assessment Upper Extremity Assessment: Generalized weakness;LUE deficits/detail;RUE deficits/detail;Difficult to assess due to impaired cognition RUE Deficits / Details: grossly 3-/5 throughout RUE Coordination: decreased gross motor LUE Deficits / Details: pt with baseline rotator cuff tear, not able to raise arm beyond grossly 30* LUE Coordination: decreased gross motor   Lower Extremity Assessment Lower Extremity Assessment: Defer to PT evaluation   Cervical / Trunk Assessment Cervical / Trunk Assessment: Kyphotic   Communication Communication Communication: Expressive difficulties;HOH(soft spoken)   Cognition Arousal/Alertness: Awake/alert Behavior During Therapy: WFL for tasks assessed/performed Overall Cognitive Status: Impaired/Different from baseline Area of Impairment: Attention;Memory;Following commands;Awareness;Problem solving                   Current Attention Level: Sustained Memory: Decreased recall of precautions;Decreased short-term memory Following Commands: Follows one step commands with increased time;Follows one step commands inconsistently   Awareness: Emergent Problem Solving: Slow processing;Decreased initiation;Difficulty sequencing;Requires verbal cues;Requires tactile cues General Comments: pt with delayed processing, soft spoken and often requiring repetition of instruction/questions (pt also HOH). pt was aware that she was leaning/drifting towards one side when sitting EOB    General Comments  SBP <180 with activity today; DBP up to 110 after return to sitting/supine from standing. Pt's family present and very supportive during session     Exercises     Shoulder Instructions      Home Living Family/patient expects to be discharged  to:: Private residence Living Arrangements: Children Available Help at Discharge: Family;Available 24 hours/day Type of Home: House Home Access: Stairs to enter [employer] of Steps: 1   Home Layout: Two level;Able to live on main level with bedroom/bathroom(pt stays on main level)     Bathroom Shower/Tub: Occupational psychologist: Handicapped height     Home Equipment: [place] - single point;Shower seat;Walker - 2 wheels          Prior Functioning/Environment Level of Independence: Needs assistance  Gait / Transfers Assistance Needed: using SPC for household mobility ADL's / Homemaking Assistance Needed: daughter occasionally helps with ADL tasks, always provides supervision for showers, occasional assistance for dressing, typically performing toileting needs on her own             OT Problem List: Decreased strength;Decreased range of motion;Decreased activity tolerance;Impaired balance (sitting and/or standing);Decreased coordination;Decreased cognition;Decreased safety awareness;Decreased knowledge of use of DME or AE;Impaired UE functional use      OT Treatment/Interventions: Self-care/ADL training;Therapeutic exercise;Neuromuscular education;Energy conservation;DME and/or AE instruction;Therapeutic activities;Cognitive remediation/compensation;Patient/family education;Balance training    OT Goals(Current goals can be found in the care plan section) Acute Rehab OT Goals Patient Stated Goal: per family, to increase her mobility prior to return home OT Goal Formulation: With patient/family Time For Goal Achievement: 02/10/20 Potential to Achieve Goals: Good  OT Frequency: Min 2X/week   Barriers to D/C:            Co-evaluation PT/OT/SLP Co-Evaluation/Treatment: Yes Reason for Co-Treatment: Necessary to address cognition/behavior during functional activity;For patient/therapist safety;To address functional/ADL transfers   OT goals addressed during  session: ADL's and self-care      AM-PAC OT "6 Clicks" Daily Activity  Outcome Measure Help from another person eating meals?: A Little Help from another person taking care of personal grooming?: A Little Help from another person toileting, which includes using toliet, bedpan, or urinal?: Total Help from another person bathing (including washing, rinsing, drying)?: A Lot Help from another person to put on and taking off regular upper body clothing?: A Little Help from another person to put on and taking off regular lower body clothing?: A Lot 6 Click Score: 14   End of Session Nurse Communication: Mobility status  Activity Tolerance: Patient tolerated treatment well;Patient limited by fatigue Patient left: in bed;with call bell/phone within reach;with bed alarm set  OT Visit Diagnosis: Other abnormalities of gait and mobility (R26.89);Muscle weakness (generalized) (M62.81);Other symptoms and signs involving cognitive function                Time: 7215-8727 OT Time Calculation (min): 28 min Charges:  OT General Charges $OT Visit: 1 Visit OT Evaluation $OT Eval Moderate Complexity: [place], OT Acute Rehabilitation Services Pager [phone] Office [phone]   Raymondo Band 01/27/2020, 1:59 PM

## 2020-01-28 ENCOUNTER — Inpatient Hospital Stay (HOSPITAL_COMMUNITY): Payer: Medicare Other

## 2020-01-28 DIAGNOSIS — I6381 Other cerebral infarction due to occlusion or stenosis of small artery: Secondary | ICD-10-CM

## 2020-01-28 LAB — COMPREHENSIVE METABOLIC PANEL
ALT: 21 U/L (ref 0–44)
AST: 38 U/L (ref 15–41)
Albumin: 3.3 g/dL — ABNORMAL LOW (ref 3.5–5.0)
Alkaline Phosphatase: 43 U/L (ref 38–126)
Anion gap: 9 (ref 5–15)
BUN: 29 mg/dL — ABNORMAL HIGH (ref 8–23)
CO2: 23 mmol/L (ref 22–32)
Calcium: 8.7 mg/dL — ABNORMAL LOW (ref 8.9–10.3)
Chloride: 101 mmol/L (ref 98–111)
Creatinine, Ser: 2 mg/dL — ABNORMAL HIGH (ref 0.44–1.00)
GFR calc Af Amer: 24 mL/min — ABNORMAL LOW (ref >60)
GFR calc non Af Amer: 21 mL/min — ABNORMAL LOW (ref >60)
Glucose, Bld: 110 mg/dL — ABNORMAL HIGH (ref 70–99)
Potassium: 5 mmol/L (ref 3.5–5.1)
Sodium: 133 mmol/L — ABNORMAL LOW (ref 135–145)
Total Bilirubin: 0.7 mg/dL (ref 0.3–1.2)
Total Protein: 6.4 g/dL — ABNORMAL LOW (ref 6.5–8.1)

## 2020-01-28 LAB — CBC
HCT: 38.4 % (ref 36.0–46.0)
Hemoglobin: 12.9 g/dL (ref 12.0–15.0)
MCH: 34.5 pg — ABNORMAL HIGH (ref 26.0–34.0)
MCHC: 33.6 g/dL (ref 30.0–36.0)
MCV: 102.7 fL — ABNORMAL HIGH (ref 80.0–100.0)
Platelets: 131 10*3/uL — ABNORMAL LOW (ref 150–400)
RBC: 3.74 MIL/uL — ABNORMAL LOW (ref 3.87–5.11)
RDW: 13.2 % (ref 11.5–15.5)
WBC: 6.5 10*3/uL (ref 4.0–10.5)
nRBC: 0 % (ref 0.0–0.2)

## 2020-01-28 LAB — GLUCOSE, CAPILLARY: Glucose-Capillary: 116 mg/dL — ABNORMAL HIGH (ref 70–99)

## 2020-01-28 LAB — PROTIME-INR
INR: 1.8 — ABNORMAL HIGH (ref 0.8–1.2)
Prothrombin Time: 20.6 seconds — ABNORMAL HIGH (ref 11.4–15.2)

## 2020-01-28 LAB — TROPONIN I (HIGH SENSITIVITY): Troponin I (High Sensitivity): 35 ng/L — ABNORMAL HIGH (ref <18)

## 2020-01-28 MED ORDER — ONDANSETRON HCL 4 MG/2ML IJ SOLN
4.0000 mg | Freq: Four times a day (QID) | INTRAMUSCULAR | Status: DC | PRN
  Administered 2020-01-28: 4 mg via INTRAVENOUS
  Filled 2020-01-28: qty 2

## 2020-01-28 MED ORDER — CARVEDILOL 3.125 MG PO TABS
3.1250 mg | ORAL_TABLET | Freq: Every day | ORAL | Status: DC
  Administered 2020-01-28 – 2020-01-29 (×2): 3.125 mg via ORAL
  Filled 2020-01-28 (×2): qty 1

## 2020-01-28 MED ORDER — SODIUM CHLORIDE 0.9 % IV SOLN: INTRAVENOUS | Status: DC

## 2020-01-28 MED ORDER — CARVEDILOL 3.125 MG PO TABS: 3.1250 mg | ORAL_TABLET | Freq: Every day | ORAL | 2 refills | Status: AC

## 2020-01-28 MED ORDER — WARFARIN SODIUM 5 MG PO TABS
5.0000 mg | ORAL_TABLET | Freq: Once | ORAL | Status: AC
  Administered 2020-01-28: 5 mg via ORAL
  Filled 2020-01-28: qty 1

## 2020-01-28 MED ORDER — ASPIRIN 325 MG PO TBEC: 325.0000 mg | DELAYED_RELEASE_TABLET | Freq: Every day | ORAL | 0 refills | Status: DC

## 2020-01-28 NOTE — Consult Note (Signed)
Physical Medicine and Rehabilitation Consult Reason for Consult: Lower extremity weakness and sensory changes Referring Physician: [facility]   HPI: Allison Rhodes is a 86 y.o. right-handed female with history of hypertension, hyperlipidemia, CKD stage III, atrial fibrillation, DVT as well as pulmonary emboli maintained on Coumadin.  Noted history of TIA 2019.  Per chart review she lives with her daughter and son-in-law for the past 13 years.  1 level home.  Use a straight point cane for household mobility.  Daughter occasionally helps with some ADLs.  Presented 01/26/2020 with lower extremity weakness and sensory changes.  CT of the head showed stable cerebral white matter disease since 2019 no acute intracranial hemorrhage or cortically based infarct identified.  Patient did not receive TPA.  MRI showed acute lacunar type infarct extending from the posterior left corona radiata to the left motor strip in the right upper extremity.  Admission chemistries with INR 1.9, BUN 29, creatinine 1.97, troponin negative, urine drug screen negative.  Echocardiogram with ejection fraction of 65%.  Neurology follow-up currently maintained on Coumadin as prior to admission as well as full-strength aspirin.  Tolerating a regular diet.  Therapy evaluations completed with recommendations of physical medicine rehab consult.   Review of Systems  Constitutional: Negative for chills and fever.  HENT: Negative for hearing loss.   Eyes: Negative for blurred vision and double vision.  Respiratory: Positive for shortness of breath.   Cardiovascular: Positive for palpitations. Negative for chest pain.  Gastrointestinal: Positive for constipation. Negative for heartburn, nausea and vomiting.  Genitourinary: Negative for dysuria and flank pain.  Musculoskeletal: Positive for joint pain and myalgias.  Skin: Negative for rash.  Neurological: Positive for sensory change and weakness.  All other systems reviewed and are  negative.  Past Medical History:  Diagnosis Date  . Arthritis    "qwhere" (03/31/2018)  . Chronic atrial fibrillation ([place])   . CKD (chronic kidney disease), stage III   . DVT (deep venous thrombosis) ([place]) 2002   ?LLE  . Heart murmur   . Hyperlipidemia   . Hypertension   . Osteoporosis   . Pneumonia 2018  . Pulmonary embolism ([place]) 2003  . Stroke [facility]) 03/31/2018   "unsteady on her feet" (03/31/2018)   Past Surgical History:  Procedure Laterality Date  . CATARACT EXTRACTION W/ INTRAOCULAR LENS  IMPLANT, BILATERAL Bilateral    Family History  Problem Relation Age of Onset  . Breast cancer Neg Hx    Social History:  reports that she has never smoked. She has never used smokeless tobacco. She reports that she does not drink alcohol or use drugs. Allergies: No Known Allergies Medications Prior to Admission  Medication Sig Dispense Refill  . atorvastatin (LIPITOR) 40 MG tablet Take 1 tablet (40 mg total) by mouth daily. 30 tablet 0  . Cyanocobalamin (VITAMIN B 12 PO) Take 1 tablet by mouth daily.    . diclofenac sodium (VOLTAREN) 1 % GEL Apply 4 g topically 3 (three) times daily as needed for pain.  2  . fluticasone (FLONASE) 50 MCG/ACT nasal spray Place 1 spray into both nostrils daily as needed for allergies or rhinitis.    . hydrochlorothiazide (HYDRODIURIL) 25 MG tablet Take 25 mg by mouth daily.    Marland Kitchen JANTOVEN 3 MG tablet Take 3.5 mg by mouth daily.    Marland Kitchen loratadine (CLARITIN) 10 MG tablet Take 10 mg by mouth daily as needed for allergies.    . Multiple Vitamins-Minerals (CENTRUM ADULTS PO) Take  1 tablet by mouth daily.    . Vitamin D, Ergocalciferol, 2000 units CAPS Take 1 tablet by mouth daily.    Marland Kitchen warfarin (JANTOVEN) 1 MG tablet Take 0.5 mg by mouth See admin instructions. Taking 1/2 tablet 0.5mg  with the 3mg   (  For 3.5mg  total)       Home: Home Living Family/patient expects to be discharged to:: Private residence Living Arrangements: Children Available Help at  Discharge: Family, Available 24 hours/day Type of Home: House Home Access: Stairs to enter Technical brewer of Steps: 1 Home Layout: Two level, Able to live on main level with bedroom/bathroom Bathroom Shower/Tub: Multimedia programmer: Handicapped height Home Equipment: Radio producer - single point, Civil engineer, contracting, Environmental consultant - 2 wheels  Lives With: Family  Functional History: Prior Function Level of Independence: Needs assistance Gait / Transfers Assistance Needed: using SPC for household mobility ADL's / Homemaking Assistance Needed: daughter occasionally helps with ADL tasks, always provides supervision for showers, occasional assistance for dressing, typically performing toileting needs on her own  Functional Status:  Mobility: Bed Mobility Overal bed mobility: Needs Assistance Bed Mobility: Supine to Sit, Sit to Supine, Rolling Rolling: Max assist Supine to sit: Max assist, +2 for physical assistance Sit to supine: Max assist, +2 for physical assistance General bed mobility comments: pt able to initiate motion but ultimately requiring assist to fully achieve LEs over EOB and to elevate trunk. Rolling to L/R end of session for straightening/chaning bed pads  Transfers Overall transfer level: Needs assistance Equipment used: 2 person hand held assist Transfers: Sit to/from Stand Sit to Stand: Mod assist, +2 physical assistance General transfer comment: Provided front guard technique and pt needs assist to lift off EOB and is not able to fully extend trunk or legs; daughter notes "this is her usual way to stand" but unclear she needed this level of assist previously Ambulation/Gait General Gait Details: deferred, pt fatigued at EOB with sitting activities and BP increased to over 100 DBP    ADL: ADL Overall ADL's : Needs assistance/impaired Eating/Feeding: Set up, Supervision/ safety, Sitting Grooming: Minimal assistance, Sitting Upper Body Bathing: Moderate assistance,  Sitting Lower Body Bathing: Maximal assistance, Sitting/lateral leans, Sit to/from stand Upper Body Dressing : Moderate assistance, Sitting Lower Body Dressing: Maximal assistance, +2 for physical assistance, Sit to/from stand Toileting- Clothing Manipulation and Hygiene: Total assistance, Sit to/from stand, Sitting/lateral lean Functional mobility during ADLs: Moderate assistance, +2 for physical assistance(sit<>stand) General ADL Comments: pt with weakness, decreased activity tolerance, impaired cognition  Cognition: Cognition Overall Cognitive Status: Impaired/Different from baseline Arousal/Alertness: Awake/alert Orientation Level: Oriented X4 Attention: Sustained Sustained Attention: Impaired Sustained Attention Impairment: Functional basic, Verbal basic Memory: Impaired Memory Impairment: Storage deficit, Retrieval deficit, Decreased recall of new information Problem Solving: Impaired Problem Solving Impairment: Verbal basic, Functional basic Safety/Judgment: Impaired Cognition Arousal/Alertness: Awake/alert Behavior During Therapy: WFL for tasks assessed/performed Overall Cognitive Status: Impaired/Different from baseline Area of Impairment: Attention, Memory, Following commands, Awareness, Problem solving Current Attention Level: Sustained Memory: Decreased recall of precautions, Decreased short-term memory Following Commands: Follows one step commands with increased time, Follows one step commands inconsistently Awareness: Emergent Problem Solving: Slow processing, Decreased initiation, Difficulty sequencing, Requires verbal cues, Requires tactile cues General Comments: pt with delayed processing, soft spoken and often requiring repetition of instruction/questions (pt also HOH). pt was aware that she was leaning/drifting towards one side when sitting EOB   Blood pressure (!) 186/104, pulse 89, temperature 98 F (36.7 C), temperature source Oral, resp. rate 18, weight 56.2  kg, SpO2 100 %. Physical Exam  Constitutional: She appears well-developed and well-nourished.  Patient is hard of hearing.  HENT:  Head: Normocephalic and atraumatic.  Eyes: Pupils are equal, round, and reactive to light. Conjunctivae and EOM are normal.  Neck: No JVD present.  Cardiovascular: Normal rate. An irregularly irregular rhythm present.  Respiratory: Effort normal and breath sounds normal. No stridor. No respiratory distress.  GI: Soft. Bowel sounds are normal. She exhibits no distension. There is no abdominal tenderness.  Musculoskeletal:     Comments: Reduced range of motion left shoulder  Neurological: She is alert.  Patient is a bit lethargic but arousable.  Makes eye contact with examiner.  Some delay in processing but provides her name and age needed some cues for appropriate year.  Follows simple commands.  Motor strength is 3/5 in the right deltoid bicep tricep finger flexors and extensors 3 - at the right hip flexor hip knee extensor and ankle plantar flexor to minus ankle dorsiflexor Left upper and left lower limb 5/5. Gait not tested secondary to weakness and poor balance Tone appears normal Sensation to light touch is equal bilaterally  Skin: Skin is warm and dry.  Psychiatric: Her affect is blunt. Her speech is delayed. She is slowed and withdrawn.    No results found for this or any previous visit (from the past 24 hour(s)). MR BRAIN WO CONTRAST  Result Date: 01/26/2020 CLINICAL DATA:  86 year old female code stroke presentation with left side weakness. EXAM: MRI HEAD WITHOUT CONTRAST TECHNIQUE: Multiplanar, multiecho pulse sequences of the brain and surrounding structures were obtained without intravenous contrast. COMPARISON:  Head CT earlier today. Brain MRI 03/31/2018. FINDINGS: Brain: 18 mm somewhat linear area of restricted diffusion tracking from the posterior left corona radiata to the left motor strip near the right upper extremity representation area  (series 5, image 46 and series 7, image 86). Faint associated T2 and FLAIR hyperintensity with no hemorrhage or mass effect. No other convincing restricted diffusion. Chronic microhemorrhages in the bilateral deep gray nuclei especially the right thalamus with some associated DWI artifact. Small chronic infarct in the left cerebellum is more apparent than in 2019 on series 10, image 5. Confluent bilateral white matter T2 and FLAIR hyperintensity appears stable. No definite chronic cortical encephalomalacia. No midline shift, mass effect, evidence of mass lesion, ventriculomegaly, extra-axial collection or acute intracranial hemorrhage. Cervicomedullary junction and pituitary are within normal limits. Vascular: Major intracranial vascular flow voids are stable since 2019. Skull and upper cervical spine: Multilevel chronic cervical spine degeneration, up to mild associated degenerative spinal stenosis (series 9, image 13). But bone marrow signal appears normal. Sinuses/Orbits: Stable and negative. Other: Mastoids remain clear. Visible internal auditory structures appear normal. IMPRESSION: 1. Acute lacunar type infarct extends from the posterior left corona radiata to the left motor strip in the right upper extremity representation area. No associated hemorrhage or mass effect. 2. No other acute intracranial abnormality. Underlying chronic small vessel disease otherwise stable to minimally increased since 2019. Electronically Signed   By: Genevie Ann M.D.   On: 01/26/2020 18:45   DG Chest Portable 1 View  Result Date: 01/26/2020 CLINICAL DATA:  Chest pain EXAM: PORTABLE CHEST 1 VIEW COMPARISON:  March 31, 2018 FINDINGS: The study is limited due to patient rotation. Stable cardiomegaly. The hila and mediastinum are unremarkable. No pneumothorax. No nodules or masses. Mild atelectasis in the left base. No other acute abnormalities. IMPRESSION: Limited study due to positioning. No acute abnormalities are noted. Mild  atelectasis in the left base. Electronically Signed   By: Dorise Bullion III M.D   On: 01/26/2020 17:14   ECHOCARDIOGRAM COMPLETE  Result Date: 01/27/2020    ECHOCARDIOGRAM REPORT   Patient Name:   Allison Rhodes Date of Exam: 01/27/2020 Medical Rec #:  [id number]         Height:       64.0 in Accession #:    [id number]        Weight:       123.9 lb Date of Birth:  1922/05/08         BSA:          1.596 m Patient Age:    89 years          BP:           174/135 mmHg Patient Gender: F                 HR:           90 bpm. Exam Location:  Inpatient Procedure: 2D Echo, Cardiac Doppler and Color Doppler Indications:    Stroke 434.91 / I163.9  History:        Patient has prior history of Echocardiogram examinations, most                 recent 04/01/2018. TIA, Arrythmias:Atrial Fibrillation; Risk                 Factors:Hypertension, Dyslipidemia and Non-Smoker.  Sonographer:    Vickie Epley RDCS Referring Phys: [id number] ASHISH ARORA IMPRESSIONS  1. Left ventricular ejection fraction, by estimation, is 60 to 65%. The left ventricle has normal function. Left ventricular endocardial border not optimally defined to evaluate regional wall motion. Left ventricular diastolic parameters are indeterminate.  2. Right ventricular systolic function is low normal. The right ventricular size is mildly enlarged. There is normal pulmonary artery systolic pressure.  3. Right atrial size was mild to moderately dilated.  4. The mitral valve is degenerative. No evidence of mitral valve regurgitation.  5. Tricuspid valve regurgitation is moderate.  6. The aortic valve was not well visualized. Aortic valve regurgitation is not visualized.  7. The inferior vena cava is normal in size with greater than 50% respiratory variability, suggesting right atrial pressure of 3 mmHg. FINDINGS  Left Ventricle: Left ventricular ejection fraction, by estimation, is 60 to 65%. The left ventricle has normal function. Left ventricular endocardial border not  optimally defined to evaluate regional wall motion. The left ventricular internal cavity size was normal in size. There is no left ventricular hypertrophy. Left ventricular diastolic parameters are indeterminate. Right Ventricle: The right ventricular size is mildly enlarged. Right vetricular wall thickness was not assessed. Right ventricular systolic function is low normal. There is normal pulmonary artery systolic pressure. The tricuspid regurgitant velocity is  2.84 m/s, and with an assumed right atrial pressure of 3 mmHg, the estimated right ventricular systolic pressure is 87.5 mmHg. Left Atrium: Left atrial size was normal in size. Right Atrium: Right atrial size was mild to moderately dilated. Pericardium: There is no evidence of pericardial effusion. Mitral Valve: The mitral valve is degenerative in appearance. There is mild thickening of the mitral valve leaflet(s). No evidence of mitral valve regurgitation. Tricuspid Valve: The tricuspid valve is grossly normal. Tricuspid valve regurgitation is moderate. Aortic Valve: The aortic valve was not well visualized. Aortic valve regurgitation is not visualized. Pulmonic Valve: The pulmonic valve was not well visualized. Pulmonic valve regurgitation is  not visualized. Aorta: The aortic root is normal in size and structure. Venous: The inferior vena cava is normal in size with greater than 50% respiratory variability, suggesting right atrial pressure of 3 mmHg. IAS/Shunts: No atrial level shunt detected by color flow Doppler.  LEFT VENTRICLE PLAX 2D LVIDd:         3.00 cm LVIDs:         2.30 cm LV PW:         0.80 cm LV IVS:        0.80 cm LVOT diam:     1.90 cm LV SV:         57 LV SV Index:   36 LVOT Area:     2.84 cm  RIGHT VENTRICLE TAPSE (M-mode): 1.6 cm LEFT ATRIUM             Index       RIGHT ATRIUM           Index LA diam:        3.00 cm 1.88 cm/m  RA Area:     18.00 cm LA Vol (A2C):   41.0 ml 25.69 ml/m RA Volume:   54.80 ml  34.33 ml/m LA Vol (A4C):    34.3 ml 21.49 ml/m LA Biplane Vol: 40.0 ml 25.06 ml/m  AORTIC VALVE LVOT Vmax:   121.00 cm/s LVOT Vmean:  67.900 cm/s LVOT VTI:    0.200 m  AORTA Ao Root diam: 2.80 cm TRICUSPID VALVE TR Peak grad:   32.3 mmHg TR Vmax:        284.00 cm/s  SHUNTS Systemic VTI:  0.20 m Systemic Diam: 1.90 cm Kate Sable MD Electronically signed by Kate Sable MD Signature Date/Time: 01/27/2020/2:10:13 PM    Final    CT HEAD CODE STROKE WO CONTRAST  Result Date: 01/26/2020 CLINICAL DATA:  Code stroke. 86 year old female with left side weakness. Last seen normal noon. EXAM: CT HEAD WITHOUT CONTRAST TECHNIQUE: Contiguous axial images were obtained from the base of the skull through the vertex without intravenous contrast. COMPARISON:  Head CT and brain MRI 03/31/2018. FINDINGS: Brain: Stable cerebral volume since 2019. Stable ventricle size and configuration. No midline shift, mass effect, or evidence of intracranial mass lesion. Chronic dystrophic basal ganglia calcifications. No acute intracranial hemorrhage identified. Patchy and confluent bilateral cerebral white matter hypodensity. Stable gray-white matter differentiation throughout the brain. No cortically based acute infarct identified. Vascular: Calcified atherosclerosis at the skull base. No suspicious intracranial vascular hyperdensity. Skull: Stable. Hyperostosis of the calvarium. Sinuses/Orbits: Visualized paranasal sinuses and mastoids are stable and well pneumatized. Other: No acute orbit or scalp soft tissue finding. ASPECTS [facility] Stroke Program Early CT Score) Total score (0-10 with 10 being normal): 10 IMPRESSION: 1. Stable cerebral white matter disease since 2019. No acute intracranial hemorrhage or cortically based infarct identified. ASPECTS 10. 2. These results were communicated to Dr. Rory Percy at 4:42 pmon 5/22/2021by text page via the [facility] messaging system. Electronically Signed   By: Genevie Ann M.D.   On: 01/26/2020 16:42   VAS US  CAROTID  Result Date: 01/27/2020 Carotid Arterial Duplex Study Indications:       CVA and Weakness. Risk Factors:      Hypertension, hyperlipidemia. Other Factors:     Permanent atrial fibrillation, on Warfarin. Comparison Study:  Prior study from 03/31/18 is available for comparison Performing Technologist: Sharion Dove RVS  Examination Guidelines: A complete evaluation includes B-mode imaging, spectral Doppler, color Doppler, and power Doppler as needed of all  accessible portions of each vessel. Bilateral testing is considered an integral part of a complete examination. Limited examinations for reoccurring indications may be performed as noted.  Right Carotid Findings: +----------+--------+--------+--------+------------------+------------------+           PSV cm/sEDV cm/sStenosisPlaque DescriptionComments           +----------+--------+--------+--------+------------------+------------------+ CCA Prox  76      12                                intimal thickening +----------+--------+--------+--------+------------------+------------------+ CCA Distal45      12                                intimal thickening +----------+--------+--------+--------+------------------+------------------+ ICA Prox  31      9               homogeneous       tortuous           +----------+--------+--------+--------+------------------+------------------+ ICA Distal78      15                                tortuous           +----------+--------+--------+--------+------------------+------------------+ ECA       53      18                                                   +----------+--------+--------+--------+------------------+------------------+ +----------+--------+-------+--------+-------------------+           PSV cm/sEDV cmsDescribeArm Pressure (mmHG) +----------+--------+-------+--------+-------------------+ Subclavian29                                          +----------+--------+-------+--------+-------------------+ +---------+--------+--+--------+--+ VertebralPSV cm/s46EDV cm/s10 +---------+--------+--+--------+--+  Left Carotid Findings: +----------+--------+--------+--------+------------------+------------------+           PSV cm/sEDV cm/sStenosisPlaque DescriptionComments           +----------+--------+--------+--------+------------------+------------------+ CCA Prox  51      8                                 intimal thickening +----------+--------+--------+--------+------------------+------------------+ CCA Distal50      9               homogeneous                          +----------+--------+--------+--------+------------------+------------------+ ICA Prox  30      9               focal and calcific                   +----------+--------+--------+--------+------------------+------------------+ ICA Distal40      13                                                   +----------+--------+--------+--------+------------------+------------------+ ECA  45      9                                                    +----------+--------+--------+--------+------------------+------------------+ +----------+--------+--------+--------+-------------------+           PSV cm/sEDV cm/sDescribeArm Pressure (mmHG) +----------+--------+--------+--------+-------------------+ [id number]                                          +----------+--------+--------+--------+-------------------+ +---------+--------+--+--------+-+ VertebralPSV cm/s29EDV cm/s6 +---------+--------+--+--------+-+     Preliminary     [facility] PHYSICAL MEDICINE & REHABILITATION PROGRESS NOTE   Subjective/Complaints:    Objective:   MR BRAIN WO CONTRAST  Result Date: 01/26/2020 CLINICAL DATA:  86 year old female code stroke presentation with left side weakness. EXAM: MRI HEAD WITHOUT CONTRAST TECHNIQUE: Multiplanar, multiecho pulse  sequences of the brain and surrounding structures were obtained without intravenous contrast. COMPARISON:  Head CT earlier today. Brain MRI 03/31/2018. FINDINGS: Brain: 18 mm somewhat linear area of restricted diffusion tracking from the posterior left corona radiata to the left motor strip near the right upper extremity representation area (series 5, image 46 and series 7, image 86). Faint associated T2 and FLAIR hyperintensity with no hemorrhage or mass effect. No other convincing restricted diffusion. Chronic microhemorrhages in the bilateral deep gray nuclei especially the right thalamus with some associated DWI artifact. Small chronic infarct in the left cerebellum is more apparent than in 2019 on series 10, image 5. Confluent bilateral white matter T2 and FLAIR hyperintensity appears stable. No definite chronic cortical encephalomalacia. No midline shift, mass effect, evidence of mass lesion, ventriculomegaly, extra-axial collection or acute intracranial hemorrhage. Cervicomedullary junction and pituitary are within normal limits. Vascular: Major intracranial vascular flow voids are stable since 2019. Skull and upper cervical spine: Multilevel chronic cervical spine degeneration, up to mild associated degenerative spinal stenosis (series 9, image 13). But bone marrow signal appears normal. Sinuses/Orbits: Stable and negative. Other: Mastoids remain clear. Visible internal auditory structures appear normal. IMPRESSION: 1. Acute lacunar type infarct extends from the posterior left corona radiata to the left motor strip in the right upper extremity representation area. No associated hemorrhage or mass effect. 2. No other acute intracranial abnormality. Underlying chronic small vessel disease otherwise stable to minimally increased since 2019. Electronically Signed   By: Genevie Ann M.D.   On: 01/26/2020 18:45   DG Chest Portable 1 View  Result Date: 01/26/2020 CLINICAL DATA:  Chest pain EXAM: PORTABLE CHEST 1  VIEW COMPARISON:  March 31, 2018 FINDINGS: The study is limited due to patient rotation. Stable cardiomegaly. The hila and mediastinum are unremarkable. No pneumothorax. No nodules or masses. Mild atelectasis in the left base. No other acute abnormalities. IMPRESSION: Limited study due to positioning. No acute abnormalities are noted. Mild atelectasis in the left base. Electronically Signed   By: Dorise Bullion III M.D   On: 01/26/2020 17:14   ECHOCARDIOGRAM COMPLETE  Result Date: 01/27/2020    ECHOCARDIOGRAM REPORT   Patient Name:   Allison Rhodes Date of Exam: 01/27/2020 Medical Rec #:  [id number]         Height:       64.0 in Accession #:    [id number]        Weight:  123.9 lb Date of Birth:  June 22, 1922         BSA:          1.596 m Patient Age:    89 years          BP:           174/135 mmHg Patient Gender: F                 HR:           90 bpm. Exam Location:  Inpatient Procedure: 2D Echo, Cardiac Doppler and Color Doppler Indications:    Stroke 434.91 / I163.9  History:        Patient has prior history of Echocardiogram examinations, most                 recent 04/01/2018. TIA, Arrythmias:Atrial Fibrillation; Risk                 Factors:Hypertension, Dyslipidemia and Non-Smoker.  Sonographer:    Vickie Epley RDCS Referring Phys: [id number] ASHISH ARORA IMPRESSIONS  1. Left ventricular ejection fraction, by estimation, is 60 to 65%. The left ventricle has normal function. Left ventricular endocardial border not optimally defined to evaluate regional wall motion. Left ventricular diastolic parameters are indeterminate.  2. Right ventricular systolic function is low normal. The right ventricular size is mildly enlarged. There is normal pulmonary artery systolic pressure.  3. Right atrial size was mild to moderately dilated.  4. The mitral valve is degenerative. No evidence of mitral valve regurgitation.  5. Tricuspid valve regurgitation is moderate.  6. The aortic valve was not well visualized. Aortic  valve regurgitation is not visualized.  7. The inferior vena cava is normal in size with greater than 50% respiratory variability, suggesting right atrial pressure of 3 mmHg. FINDINGS  Left Ventricle: Left ventricular ejection fraction, by estimation, is 60 to 65%. The left ventricle has normal function. Left ventricular endocardial border not optimally defined to evaluate regional wall motion. The left ventricular internal cavity size was normal in size. There is no left ventricular hypertrophy. Left ventricular diastolic parameters are indeterminate. Right Ventricle: The right ventricular size is mildly enlarged. Right vetricular wall thickness was not assessed. Right ventricular systolic function is low normal. There is normal pulmonary artery systolic pressure. The tricuspid regurgitant velocity is  2.84 m/s, and with an assumed right atrial pressure of 3 mmHg, the estimated right ventricular systolic pressure is 85.8 mmHg. Left Atrium: Left atrial size was normal in size. Right Atrium: Right atrial size was mild to moderately dilated. Pericardium: There is no evidence of pericardial effusion. Mitral Valve: The mitral valve is degenerative in appearance. There is mild thickening of the mitral valve leaflet(s). No evidence of mitral valve regurgitation. Tricuspid Valve: The tricuspid valve is grossly normal. Tricuspid valve regurgitation is moderate. Aortic Valve: The aortic valve was not well visualized. Aortic valve regurgitation is not visualized. Pulmonic Valve: The pulmonic valve was not well visualized. Pulmonic valve regurgitation is not visualized. Aorta: The aortic root is normal in size and structure. Venous: The inferior vena cava is normal in size with greater than 50% respiratory variability, suggesting right atrial pressure of 3 mmHg. IAS/Shunts: No atrial level shunt detected by color flow Doppler.  LEFT VENTRICLE PLAX 2D LVIDd:         3.00 cm LVIDs:         2.30 cm LV PW:         0.80 cm LV IVS:  0.80 cm LVOT diam:     1.90 cm LV SV:         57 LV SV Index:   36 LVOT Area:     2.84 cm  RIGHT VENTRICLE TAPSE (M-mode): 1.6 cm LEFT ATRIUM             Index       RIGHT ATRIUM           Index LA diam:        3.00 cm 1.88 cm/m  RA Area:     18.00 cm LA Vol (A2C):   41.0 ml 25.69 ml/m RA Volume:   54.80 ml  34.33 ml/m LA Vol (A4C):   34.3 ml 21.49 ml/m LA Biplane Vol: 40.0 ml 25.06 ml/m  AORTIC VALVE LVOT Vmax:   121.00 cm/s LVOT Vmean:  67.900 cm/s LVOT VTI:    0.200 m  AORTA Ao Root diam: 2.80 cm TRICUSPID VALVE TR Peak grad:   32.3 mmHg TR Vmax:        284.00 cm/s  SHUNTS Systemic VTI:  0.20 m Systemic Diam: 1.90 cm Kate Sable MD Electronically signed by Kate Sable MD Signature Date/Time: 01/27/2020/2:10:13 PM    Final    CT HEAD CODE STROKE WO CONTRAST  Result Date: 01/26/2020 CLINICAL DATA:  Code stroke. 86 year old female with left side weakness. Last seen normal noon. EXAM: CT HEAD WITHOUT CONTRAST TECHNIQUE: Contiguous axial images were obtained from the base of the skull through the vertex without intravenous contrast. COMPARISON:  Head CT and brain MRI 03/31/2018. FINDINGS: Brain: Stable cerebral volume since 2019. Stable ventricle size and configuration. No midline shift, mass effect, or evidence of intracranial mass lesion. Chronic dystrophic basal ganglia calcifications. No acute intracranial hemorrhage identified. Patchy and confluent bilateral cerebral white matter hypodensity. Stable gray-white matter differentiation throughout the brain. No cortically based acute infarct identified. Vascular: Calcified atherosclerosis at the skull base. No suspicious intracranial vascular hyperdensity. Skull: Stable. Hyperostosis of the calvarium. Sinuses/Orbits: Visualized paranasal sinuses and mastoids are stable and well pneumatized. Other: No acute orbit or scalp soft tissue finding. ASPECTS [facility] Stroke Program Early CT Score) Total score (0-10 with 10 being normal): 10  IMPRESSION: 1. Stable cerebral white matter disease since 2019. No acute intracranial hemorrhage or cortically based infarct identified. ASPECTS 10. 2. These results were communicated to Dr. Rory Percy at 4:42 pmon 5/22/2021by text page via the [facility] messaging system. Electronically Signed   By: Genevie Ann M.D.   On: 01/26/2020 16:42   VAS US CAROTID  Result Date: 01/27/2020 Carotid Arterial Duplex Study Indications:       CVA and Weakness. Risk Factors:      Hypertension, hyperlipidemia. Other Factors:     Permanent atrial fibrillation, on Warfarin. Comparison Study:  Prior study from 03/31/18 is available for comparison Performing Technologist: Sharion Dove RVS  Examination Guidelines: A complete evaluation includes B-mode imaging, spectral Doppler, color Doppler, and power Doppler as needed of all accessible portions of each vessel. Bilateral testing is considered an integral part of a complete examination. Limited examinations for reoccurring indications may be performed as noted.  Right Carotid Findings: +----------+--------+--------+--------+------------------+------------------+           PSV cm/sEDV cm/sStenosisPlaque DescriptionComments           +----------+--------+--------+--------+------------------+------------------+ CCA Prox  76      12  intimal thickening +----------+--------+--------+--------+------------------+------------------+ CCA Distal45      12                                intimal thickening +----------+--------+--------+--------+------------------+------------------+ ICA Prox  31      9               homogeneous       tortuous           +----------+--------+--------+--------+------------------+------------------+ ICA Distal78      15                                tortuous           +----------+--------+--------+--------+------------------+------------------+ ECA       53      18                                                    +----------+--------+--------+--------+------------------+------------------+ +----------+--------+-------+--------+-------------------+           PSV cm/sEDV cmsDescribeArm Pressure (mmHG) +----------+--------+-------+--------+-------------------+ Subclavian29                                         +----------+--------+-------+--------+-------------------+ +---------+--------+--+--------+--+ VertebralPSV cm/s46EDV cm/s10 +---------+--------+--+--------+--+  Left Carotid Findings: +----------+--------+--------+--------+------------------+------------------+           PSV cm/sEDV cm/sStenosisPlaque DescriptionComments           +----------+--------+--------+--------+------------------+------------------+ CCA Prox  51      8                                 intimal thickening +----------+--------+--------+--------+------------------+------------------+ CCA Distal50      9               homogeneous                          +----------+--------+--------+--------+------------------+------------------+ ICA Prox  30      9               focal and calcific                   +----------+--------+--------+--------+------------------+------------------+ ICA Distal40      13                                                   +----------+--------+--------+--------+------------------+------------------+ ECA       45      9                                                    +----------+--------+--------+--------+------------------+------------------+ +----------+--------+--------+--------+-------------------+           PSV cm/sEDV cm/sDescribeArm Pressure (mmHG) +----------+--------+--------+--------+-------------------+ [id number]                                          +----------+--------+--------+--------+-------------------+ +---------+--------+--+--------+-+  [place] cm/s29EDV cm/s6 +---------+--------+--+--------+-+     Preliminary      Recent Labs    01/26/20 1628 01/26/20 1629  WBC 4.7  --   HGB 13.2 13.9  HCT 41.3 41.0  PLT 145*  --    Recent Labs    01/26/20 1628 01/26/20 1629  NA 143 143  K 4.3 4.1  CL 104 107  CO2 27  --   GLUCOSE 93 89  BUN 29* 32*  CREATININE 1.97* 1.90*  CALCIUM 9.4  --     Intake/Output Summary (Last 24 hours) at 01/28/2020 1119 Last data filed at 01/28/2020 0500 Gross per 24 hour  Intake 120 ml  Output 700 ml  Net -580 ml     Physical Exam: Vital Signs Blood pressure (!) 144/77, pulse 88, temperature 98.3 F (36.8 C), temperature source Axillary, resp. rate 20, weight 56.2 kg, SpO2 100 %.     Assessment/Plan: 1. Functional deficits secondary to left corona radiata infarct which require 3+ hours per day of interdisciplinary therapy in a comprehensive inpatient rehab setting.  Physiatrist is providing close team supervision and 24 hour management of active medical problems listed below.  Physiatrist and rehab team continue to assess barriers to discharge/monitor patient progress toward functional and medical goals  Care Tool:  Bathing              Bathing assist       Upper Body Dressing/Undressing Upper body dressing        Upper body assist      Lower Body Dressing/Undressing Lower body dressing            Lower body assist       Toileting Toileting    Toileting assist       Transfers Chair/bed transfer  Transfers assist           Locomotion Ambulation   Ambulation assist              Walk 10 feet activity   Assist           Walk 50 feet activity   Assist           Walk 150 feet activity   Assist           Walk 10 feet on uneven surface  activity   Assist           Wheelchair     Assist               Wheelchair 50 feet with 2 turns activity    Assist            Wheelchair 150 feet activity     Assist          Blood pressure (!) 144/77, pulse 88,  temperature 98.3 F (36.8 C), temperature source Axillary, resp. rate 20, weight 56.2 kg, SpO2 100 %.     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 Does the need for close, 24 hr/day medical supervision in concert with the patient's rehab needs make it unreasonable for this patient to be served in a less intensive setting? Potentially 2. Co-Morbidities requiring supervision/potential complications: Left rotator cuff tear, atrial fibrillation, history of PE, hypertension 3. Due to bladder management, bowel management, safety, skin/wound care, disease management, medication administration, pain management and patient education, does the patient require 24 hr/day rehab nursing? Potentially 4. Does the patient require coordinated care of a physician, rehab nurse, therapy disciplines of PT, OT,  speech therapy to address physical and functional deficits in the context of the above medical diagnosis(es)? Potentially Addressing deficits in the following areas: balance, endurance, locomotion, strength, transferring, bowel/bladder control, bathing, dressing, toileting, cognition and psychosocial support 5. Can the patient actively participate in an intensive therapy program of at least 3 hrs of therapy per day at least 5 days per week? Yes 6. The potential for patient to make measurable gains while on inpatient rehab is good 7. Anticipated functional outcomes upon discharge from inpatient rehab are min assist  with PT, min assist with OT, min assist with SLP. 8. Estimated rehab length of stay to reach the above functional goals is: 14-18d 9. Anticipated discharge destination: Home 10. Overall Rehab/Functional Prognosis: fair  RECOMMENDATIONS: This patient's condition is appropriate for continued rehabilitative care in the following setting: Would benefit from inpatient rehabilitation, family would like to take her home.  Recommend ordering home health PT OT  speech Patient has agreed to participate in recommended program. N/A Note that insurance prior authorization may be required for reimbursement for recommended care.  Comment: As discussed with the daughter will need physical therapy to go over her transfer training with her.  will place an order to make a follow-up appointment with me in physical medicine and rehabilitation clinic  LOS: 2 days A FACE TO [place] 01/28/2020, 11:19 AM     Lavon Paganini [place], PA-C 01/28/2020

## 2020-01-28 NOTE — Significant Event (Signed)
Rapid Response Event Note  Overview: Time Called: 1624 Arrival Time: 1518 Event Type: Neurologic LKN: 1500. Pt now has worsening left facial droop and aphasia.  Initial Focused Assessment: Pt lying in bed, alert to person & place. Pt disoriented to month and age. Pt intermittently following commands. Responses are delayed. Pt noted to have bilateral complete hemianopia. Left facial weakness. Right arm drift and weakness, pt noted to have right arm weakness from recent stroke. Pt has difficulty following two step commands, is unable to identify or describe NIHSS pictures. Speech is clear, pt is soft spoken. Pt does not appear to be in pain. Pt later endorses pain in her chest when asked. Urine appears concentrated, clear- yellow.   Interventions: -CBG 116 -VS: BP 132/97, HR 76, RR 20, SpO2 96% on room air -Dr. Rodena Piety at bedside to evaluate pt -Code stroke activated at [place] (if not transferred): -Code stroke cancelled per Dr. Leonie Man note -CBC, CMP, Troponin, EKG, EEG ordered  Event Summary: Name of Physician Notified: Dr. Rodena Piety at (Notified by primary RN. MD at bedside to evaluate pt.) Outcome: Stayed in room and stabalized Event End Time: 1558(RR RN called away to another emergency)  Allison Rhodes

## 2020-01-28 NOTE — Discharge Summary (Addendum)
Physician Discharge Summary  Allison Rhodes MPN:[id number] DOB: 12/28/1921 DOA: 01/26/2020  PCP: Merrilee Seashore, MD  Admit date: 01/26/2020 Discharge date:01/29/2020 Admitted From Disposition:  Recommendations for Outpatient Follow-up:  1. Follow up with PCP in 1-2 weeks 2. Please obtain BMP/CBC in one week 3. Follow up with [facility] 4. Check INR 01/31/2020 Home Health pt Equipment/Devices:none  Discharge Condition:stable CODE STATUS:full Diet recommendation cardiac Brief/Interim Summary:HPI per Dr. Willeen Cass y.o.femalewith PMH ofHTN, HLD, a Fib on Warfarin and hx of DVT/PE presented to ED with weakness and admitted for acute lacunar infarct.   History provided by patient and daughter who is at bedside. Reports patient was in her usual state of health this morning. She took a nap this afternoon and when she woke up, she was very weak. Normally she requires minimal assistance to ambulate but her daughter had to basically hold her up and then called EMS. Weakness not localized to either side. Denies facial assymetry, speech difficulty, confusion or other complaints. Patient complained of chest pain when EMS arrived and was given Aspirin. Denies chest pain currently. Also denies headache, dizziness, fever, chills, cough, SOB, abdominal pain, nausea, vomiting, diarrhea, constipation, dysuria, hematuria, hematochezia, melena, speech difficulty, trouble eating, confusion or any other complaints.  In the MG:[id number] and intermittently tachycardic otherwise stable on room air. Labs remarkable forglucose 82, INR 1.9. CBC/PTT/Ethanol level WNL. Trop neg x2. Cr 1.97. UDS neg. UA without evidence of infection. Ammonia pending.   CT Head: Stablecerebral white matter disease since 2019. No acuteintracranial hemorrhage or cortically based infarct identified.  ZTI:[id number] study due to positioning. No acute abnormalities are noted. Mild atelectasis in the left base. MRI  Brain: 1. Acute lacunar type infarct extends from the posterior left corona radiata to the left motor strip in the right upper extremity representation area. No associated hemorrhage or mass effect.  2. No other acute intracranial abnormality. Underlying chronic small vessel disease otherwise stable to minimally increased since 2019.   Discharge Diagnoses:  Principal Problem:   Lacunar infarct, acute ([place]) Active Problems:   Hypertension   Hyperlipemia   History of pulmonary embolism   CKD (chronic kidney disease)   Atrial fibrillation, chronic (HCC)   Chronic anticoagulation   Hypertensive urgency   #1 acute left lacunar infarct- Secondary to uncontrolled hypertension. MRI of the brain-acute lacunar type infarct extending from the posterior left coronary radiator to the left motor strip in the right upper extremity representation area.  No hemorrhage or mass-effect.  Underlying chronic small vessel disease stable to minimally increased since 2019. Echocardiogram-Left ventricular ejection fraction, by estimation, is 60 to 65%. The  left ventricle has normal function. Left ventricular endocardial border  not optimally defined to evaluate regional wall motion. Left ventricular  diastolic parameters are  indeterminate.  Right ventricular systolic function is low normal. The right  ventricular size is mildly enlarged. There is normal pulmonary artery  systolic pressure.   Right atrial size was mild to moderately dilated.   The mitral valve is degenerative. No evidence of mitral valve  regurgitation.   Tricuspid valve regurgitation is moderate.  The aortic valve was not well visualized. Aortic valve regurgitation  is not visualized.   The inferior vena cava is normal in size with greater than 50%  respiratory variability, suggesting right atrial pressure of 3 mmHg Vascular ultrasound pending Chest x-ray mild left basilar atelectasis. PT recommends CIR Family refused CIR,  they wanted to take her home with home health.  Her INR on admission was 1.5.  Neurology recommended to keep her INR between 2.5 and 3 and to keep her on aspirin till the INR level is achieved.  Once her INR is up to the desired level DC aspirin completely. LDL 86, A1c 6.2, ammonia level 18, troponin negative, urine drug screen negative, UA with proteinuria.  Her discharge was canceled on 01/28/2020 as she had a change in mental status with dysarthria and difficult to respond and did not follow commands.  So she was observed overnight without any new findings. CT head MRI of the head was repeated with no acute changes other than what we already knew. EKG showed some lateral T wave inversions with a mild increase in high-sensitivity troponin 35 which I discussed with cardiology and recommended medical management.  Recommended to continue Coumadin statin and Coreg.  UA was negative. #2 essential hypertension-continue HCTZ.  Added Coreg 3.125 mg daily.  Her heart rate was also in the high 90s to low 100s.    #3 chronic atrial fibrillation on Coumadin  #4 DVT and PE on Coumadin  #5 CKD stage IIIb stable  #6 hyperlipidemia on Lipitor  Estimated body mass index is 21.27 kg/m as calculated from the following:   Height as of 05/16/18: 5\' 4"  (1.626 m).   Weight as of this encounter: 56.2 kg.  Discharge Instructions  Discharge Instructions    Ambulatory referral to Neurology   Complete by: As directed    Follow up with stroke clinic NP (Jessica Vanschaick or Cecille Rubin, if both not available, consider Zachery Dauer, or [facility]) at [facility] in about 4 weeks. Thanks.   Diet - low sodium heart healthy   Complete by: As directed    Increase activity slowly   Complete by: As directed      Allergies as of 01/28/2020   No Known Allergies     Medication List    TAKE these medications   aspirin 325 MG EC tablet Take 1 tablet (325 mg total) by mouth daily. Start taking on: Jan 29, 2020    atorvastatin 40 MG tablet Commonly known as: LIPITOR Take 1 tablet (40 mg total) by mouth daily.   carvedilol 3.125 MG tablet Commonly known as: COREG Take 1 tablet (3.125 mg total) by mouth daily.   CENTRUM ADULTS PO Take 1 tablet by mouth daily.   diclofenac sodium 1 % Gel Commonly known as: VOLTAREN Apply 4 g topically 3 (three) times daily as needed for pain.   fluticasone 50 MCG/ACT nasal spray Commonly known as: FLONASE Place 1 spray into both nostrils daily as needed for allergies or rhinitis.   hydrochlorothiazide 25 MG tablet Commonly known as: HYDRODIURIL Take 25 mg by mouth daily.   Jantoven 1 MG tablet Generic drug: warfarin Take 0.5 mg by mouth See admin instructions. Taking 1/2 tablet 0.5mg  with the 3mg   (  For 3.5mg  total)   Jantoven 3 MG tablet Generic drug: warfarin Take 3.5 mg by mouth daily.   loratadine 10 MG tablet Commonly known as: CLARITIN Take 10 mg by mouth daily as needed for allergies.   VITAMIN B 12 PO Take 1 tablet by mouth daily.   Vitamin D (Ergocalciferol) 50 MCG (2000 UT) Caps Take 1 tablet by mouth daily.      Follow-up Information    [facility]. Schedule an appointment as soon as possible for a visit in 4 week(s).   Specialty: Neurology Contact information: [address] [place] [phone]  Merrilee Seashore, MD Follow up.   Specialty: Internal Medicine Why: Please keep her INR between 2.5 and 3.  Continue aspirin 325 mg daily till therapeutic INR achieved.  Then DC aspirin. Contact information: [address] [place] [address] [phone]          No Known Allergies  Consultations: Neurology.  Procedures/Studies: MR BRAIN WO CONTRAST  Result Date: 01/26/2020 CLINICAL DATA:  86 year old female code stroke presentation with left side weakness. EXAM: MRI HEAD WITHOUT CONTRAST TECHNIQUE: Multiplanar, multiecho pulse  sequences of the brain and surrounding structures were obtained without intravenous contrast. COMPARISON:  Head CT earlier today. Brain MRI 03/31/2018. FINDINGS: Brain: 18 mm somewhat linear area of restricted diffusion tracking from the posterior left corona radiata to the left motor strip near the right upper extremity representation area (series 5, image 46 and series 7, image 86). Faint associated T2 and FLAIR hyperintensity with no hemorrhage or mass effect. No other convincing restricted diffusion. Chronic microhemorrhages in the bilateral deep gray nuclei especially the right thalamus with some associated DWI artifact. Small chronic infarct in the left cerebellum is more apparent than in 2019 on series 10, image 5. Confluent bilateral white matter T2 and FLAIR hyperintensity appears stable. No definite chronic cortical encephalomalacia. No midline shift, mass effect, evidence of mass lesion, ventriculomegaly, extra-axial collection or acute intracranial hemorrhage. Cervicomedullary junction and pituitary are within normal limits. Vascular: Major intracranial vascular flow voids are stable since 2019. Skull and upper cervical spine: Multilevel chronic cervical spine degeneration, up to mild associated degenerative spinal stenosis (series 9, image 13). But bone marrow signal appears normal. Sinuses/Orbits: Stable and negative. Other: Mastoids remain clear. Visible internal auditory structures appear normal. IMPRESSION: 1. Acute lacunar type infarct extends from the posterior left corona radiata to the left motor strip in the right upper extremity representation area. No associated hemorrhage or mass effect. 2. No other acute intracranial abnormality. Underlying chronic small vessel disease otherwise stable to minimally increased since 2019. Electronically Signed   By: Genevie Ann M.D.   On: 01/26/2020 18:45   DG Chest Portable 1 View  Result Date: 01/26/2020 CLINICAL DATA:  Chest pain EXAM: PORTABLE CHEST 1  VIEW COMPARISON:  March 31, 2018 FINDINGS: The study is limited due to patient rotation. Stable cardiomegaly. The hila and mediastinum are unremarkable. No pneumothorax. No nodules or masses. Mild atelectasis in the left base. No other acute abnormalities. IMPRESSION: Limited study due to positioning. No acute abnormalities are noted. Mild atelectasis in the left base. Electronically Signed   By: Dorise Bullion III M.D   On: 01/26/2020 17:14   ECHOCARDIOGRAM COMPLETE  Result Date: 01/27/2020    ECHOCARDIOGRAM REPORT   Patient Name:   Allison Rhodes Date of Exam: 01/27/2020 Medical Rec #:  [id number]         Height:       64.0 in Accession #:    [id number]        Weight:       123.9 lb Date of Birth:  [birth year]         BSA:          1.596 m Patient Age:    86 years          BP:           174/135 mmHg Patient Gender: F                 HR:  90 bpm. Exam Location:  Inpatient Procedure: 2D Echo, Cardiac Doppler and Color Doppler Indications:    Stroke 434.91 / I163.9  History:        Patient has prior history of Echocardiogram examinations, most                 recent 04/01/2018. TIA, Arrythmias:Atrial Fibrillation; Risk                 Factors:Hypertension, Dyslipidemia and Non-Smoker.  Sonographer:    Vickie Epley RDCS Referring Phys: [id number] ASHISH ARORA IMPRESSIONS  1. Left ventricular ejection fraction, by estimation, is 60 to 65%. The left ventricle has normal function. Left ventricular endocardial border not optimally defined to evaluate regional wall motion. Left ventricular diastolic parameters are indeterminate.  2. Right ventricular systolic function is low normal. The right ventricular size is mildly enlarged. There is normal pulmonary artery systolic pressure.  3. Right atrial size was mild to moderately dilated.  4. The mitral valve is degenerative. No evidence of mitral valve regurgitation.  5. Tricuspid valve regurgitation is moderate.  6. The aortic valve was not well visualized. Aortic  valve regurgitation is not visualized.  7. The inferior vena cava is normal in size with greater than 50% respiratory variability, suggesting right atrial pressure of 3 mmHg. FINDINGS  Left Ventricle: Left ventricular ejection fraction, by estimation, is 60 to 65%. The left ventricle has normal function. Left ventricular endocardial border not optimally defined to evaluate regional wall motion. The left ventricular internal cavity size was normal in size. There is no left ventricular hypertrophy. Left ventricular diastolic parameters are indeterminate. Right Ventricle: The right ventricular size is mildly enlarged. Right vetricular wall thickness was not assessed. Right ventricular systolic function is low normal. There is normal pulmonary artery systolic pressure. The tricuspid regurgitant velocity is  2.84 m/s, and with an assumed right atrial pressure of 3 mmHg, the estimated right ventricular systolic pressure is 85.8 mmHg. Left Atrium: Left atrial size was normal in size. Right Atrium: Right atrial size was mild to moderately dilated. Pericardium: There is no evidence of pericardial effusion. Mitral Valve: The mitral valve is degenerative in appearance. There is mild thickening of the mitral valve leaflet(s). No evidence of mitral valve regurgitation. Tricuspid Valve: The tricuspid valve is grossly normal. Tricuspid valve regurgitation is moderate. Aortic Valve: The aortic valve was not well visualized. Aortic valve regurgitation is not visualized. Pulmonic Valve: The pulmonic valve was not well visualized. Pulmonic valve regurgitation is not visualized. Aorta: The aortic root is normal in size and structure. Venous: The inferior vena cava is normal in size with greater than 50% respiratory variability, suggesting right atrial pressure of 3 mmHg. IAS/Shunts: No atrial level shunt detected by color flow Doppler.  LEFT VENTRICLE PLAX 2D LVIDd:         3.00 cm LVIDs:         2.30 cm LV PW:         0.80 cm LV IVS:         0.80 cm LVOT diam:     1.90 cm LV SV:         57 LV SV Index:   36 LVOT Area:     2.84 cm  RIGHT VENTRICLE TAPSE (M-mode): 1.6 cm LEFT ATRIUM             Index       RIGHT ATRIUM           Index LA diam:  3.00 cm 1.88 cm/m  RA Area:     18.00 cm LA Vol (A2C):   41.0 ml 25.69 ml/m RA Volume:   54.80 ml  34.33 ml/m LA Vol (A4C):   34.3 ml 21.49 ml/m LA Biplane Vol: 40.0 ml 25.06 ml/m  AORTIC VALVE LVOT Vmax:   121.00 cm/s LVOT Vmean:  67.900 cm/s LVOT VTI:    0.200 m  AORTA Ao Root diam: 2.80 cm TRICUSPID VALVE TR Peak grad:   32.3 mmHg TR Vmax:        284.00 cm/s  SHUNTS Systemic VTI:  0.20 m Systemic Diam: 1.90 cm Kate Sable MD Electronically signed by Kate Sable MD Signature Date/Time: 01/27/2020/2:10:13 PM    Final    CT HEAD CODE STROKE WO CONTRAST  Result Date: 01/26/2020 CLINICAL DATA:  Code stroke. 86 year old female with left side weakness. Last seen normal noon. EXAM: CT HEAD WITHOUT CONTRAST TECHNIQUE: Contiguous axial images were obtained from the base of the skull through the vertex without intravenous contrast. COMPARISON:  Head CT and brain MRI 03/31/2018. FINDINGS: Brain: Stable cerebral volume since 2019. Stable ventricle size and configuration. No midline shift, mass effect, or evidence of intracranial mass lesion. Chronic dystrophic basal ganglia calcifications. No acute intracranial hemorrhage identified. Patchy and confluent bilateral cerebral white matter hypodensity. Stable gray-white matter differentiation throughout the brain. No cortically based acute infarct identified. Vascular: Calcified atherosclerosis at the skull base. No suspicious intracranial vascular hyperdensity. Skull: Stable. Hyperostosis of the calvarium. Sinuses/Orbits: Visualized paranasal sinuses and mastoids are stable and well pneumatized. Other: No acute orbit or scalp soft tissue finding. ASPECTS [facility] Stroke Program Early CT Score) Total score (0-10 with 10 being normal): 10  IMPRESSION: 1. Stable cerebral white matter disease since 2019. No acute intracranial hemorrhage or cortically based infarct identified. ASPECTS 10. 2. These results were communicated to Dr. Rory Percy at 4:42 pmon 5/22/2021by text page via the [facility] messaging system. Electronically Signed   By: Genevie Ann M.D.   On: 01/26/2020 16:42   VAS US CAROTID  Result Date: 01/27/2020 Carotid Arterial Duplex Study Indications:       CVA and Weakness. Risk Factors:      Hypertension, hyperlipidemia. Other Factors:     Permanent atrial fibrillation, on Warfarin. Comparison Study:  Prior study from 03/31/18 is available for comparison Performing Technologist: Sharion Dove RVS  Examination Guidelines: A complete evaluation includes B-mode imaging, spectral Doppler, color Doppler, and power Doppler as needed of all accessible portions of each vessel. Bilateral testing is considered an integral part of a complete examination. Limited examinations for reoccurring indications may be performed as noted.  Right Carotid Findings: +----------+--------+--------+--------+------------------+------------------+           PSV cm/sEDV cm/sStenosisPlaque DescriptionComments           +----------+--------+--------+--------+------------------+------------------+ CCA Prox  76      12                                intimal thickening +----------+--------+--------+--------+------------------+------------------+ CCA Distal45      12                                intimal thickening +----------+--------+--------+--------+------------------+------------------+ ICA Prox  31      9               homogeneous       tortuous           +----------+--------+--------+--------+------------------+------------------+  ICA Distal78      15                                tortuous           +----------+--------+--------+--------+------------------+------------------+ ECA       53      18                                                    +----------+--------+--------+--------+------------------+------------------+ +----------+--------+-------+--------+-------------------+           PSV cm/sEDV cmsDescribeArm Pressure (mmHG) +----------+--------+-------+--------+-------------------+ Subclavian29                                         +----------+--------+-------+--------+-------------------+ +---------+--------+--+--------+--+ VertebralPSV cm/s46EDV cm/s10 +---------+--------+--+--------+--+  Left Carotid Findings: +----------+--------+--------+--------+------------------+------------------+           PSV cm/sEDV cm/sStenosisPlaque DescriptionComments           +----------+--------+--------+--------+------------------+------------------+ CCA Prox  51      8                                 intimal thickening +----------+--------+--------+--------+------------------+------------------+ CCA Distal50      9               homogeneous                          +----------+--------+--------+--------+------------------+------------------+ ICA Prox  30      9               focal and calcific                   +----------+--------+--------+--------+------------------+------------------+ ICA Distal40      13                                                   +----------+--------+--------+--------+------------------+------------------+ ECA       45      9                                                    +----------+--------+--------+--------+------------------+------------------+ +----------+--------+--------+--------+-------------------+           PSV cm/sEDV cm/sDescribeArm Pressure (mmHG) +----------+--------+--------+--------+-------------------+ [id number]                                          +----------+--------+--------+--------+-------------------+ +---------+--------+--+--------+-+ VertebralPSV cm/s29EDV cm/s6 +---------+--------+--+--------+-+     Preliminary      (Echo, Carotid, EGD, Colonoscopy, ERCP)    Subjective:  Patient resting in bed in no acute distress no events overnight reported by the staff. Discharge Exam: Vitals:   01/28/20 0627 01/28/20 0744  BP: (!) 159/98 (!) 144/77  Pulse: 72 88  Resp: 19 20  Temp:  98.3 F (36.8 C)  SpO2: 98% 100%   Vitals:   01/27/20 2346 01/28/20 0447 01/28/20 0627 01/28/20 0744  BP: (!) 186/104 (!) 189/94 (!) 159/98 (!) 144/77  Pulse: 89 76 72 88  Resp: 18 19 19 20   Temp: 98 F (36.7 C) 98 F (36.7 C)  98.3 F (36.8 C)  TempSrc: Oral Oral  Axillary  SpO2: 100% 98% 98% 100%  Weight:        General: Pt is alert, awake, not in acute distress Cardiovascular: RRR, S1/S2 +, no rubs, no gallops Respiratory: CTA bilaterally, no wheezing, no rhonchi Abdominal: Soft, NT, ND, bowel sounds + Extremities: no edema, no cyanosis    The results of significant diagnostics from this hospitalization (including imaging, microbiology, ancillary and laboratory) are listed below for reference.     Microbiology: Recent Results (from the past 240 hour(s))  SARS Coronavirus 2 by RT PCR (hospital order, performed in [facility] hospital lab) Nasopharyngeal Nasopharyngeal Swab     Status: None   Collection Time: 01/26/20 10:29 PM   Specimen: Nasopharyngeal Swab  Result Value Ref Range Status   SARS Coronavirus 2 NEGATIVE NEGATIVE Final    Comment: (NOTE) SARS-CoV-2 target nucleic acids are NOT DETECTED. The SARS-CoV-2 RNA is generally detectable in upper and lower respiratory specimens during the acute phase of infection. The lowest concentration of SARS-CoV-2 viral copies this assay can detect is 250 copies / mL. A negative result does not preclude SARS-CoV-2 infection and should not be used as the sole basis for treatment or other patient management decisions.  A negative result may occur with improper specimen collection / handling, submission of specimen other than nasopharyngeal swab, presence of  viral mutation(s) within the areas targeted by this assay, and inadequate number of viral copies (<250 copies / mL). A negative result must be combined with clinical observations, patient history, and epidemiological information. Fact Sheet for Patients:   StrictlyIdeas.no Fact Sheet for Healthcare Providers: BankingDealers.co.za This test is not yet approved or cleared  by the [place] FDA and has been authorized for detection and/or diagnosis of SARS-CoV-2 by FDA under an Emergency Use Authorization (EUA).  This EUA will remain in effect (meaning this test can be used) for the duration of the COVID-19 declaration under Section 564(b)(1) of the Act, 21 U.S.C. section 360bbb-3(b)(1), unless the authorization is terminated or revoked sooner. Performed at [facility], [place] [address], [place], [place] [address]      Labs: BNP (last 3 results) No results for input(s): BNP in the last 8760 hours. Basic Metabolic Panel: Recent Labs  Lab 01/26/20 1628 01/26/20 1629  NA 143 143  K 4.3 4.1  CL 104 107  CO2 27  --   GLUCOSE 93 89  BUN 29* 32*  CREATININE 1.97* 1.90*  CALCIUM 9.4  --    Liver Function Tests: Recent Labs  Lab 01/26/20 1628  AST 31  ALT 23  ALKPHOS 47  BILITOT 1.1  PROT 7.4  ALBUMIN 3.9   No results for input(s): LIPASE, AMYLASE in the last 168 hours. Recent Labs  Lab 01/26/20 2108  AMMONIA 18   CBC: Recent Labs  Lab 01/26/20 1628 01/26/20 1629  WBC 4.7  --   NEUTROABS 2.3  --   HGB 13.2 13.9  HCT 41.3 41.0  MCV 107.0*  --   PLT 145*  --    Cardiac Enzymes: No results for input(s): CKTOTAL, CKMB, CKMBINDEX, TROPONINI in the last 168 hours.  BNP: Invalid input(s): POCBNP CBG: Recent Labs  Lab 01/26/20 1619  GLUCAP 82   D-Dimer No results for input(s): DDIMER in the last 72 hours. Hgb A1c Recent Labs    01/27/20 0446  HGBA1C 6.2*   Lipid Profile Recent Labs     01/27/20 0446  CHOL 142  HDL 46  LDLCALC 86  TRIG 52  CHOLHDL 3.1   Thyroid function studies No results for input(s): TSH, T4TOTAL, T3FREE, THYROIDAB in the last 72 hours.  Invalid input(s): FREET3 Anemia work up No results for input(s): VITAMINB12, FOLATE, FERRITIN, TIBC, IRON, RETICCTPCT in the last 72 hours. Urinalysis    Component Value Date/Time   COLORURINE STRAW (A) 01/26/2020 2028   APPEARANCEUR CLEAR 01/26/2020 2028   LABSPEC 1.010 01/26/2020 2028   PHURINE 8.0 01/26/2020 2028   GLUCOSEU NEGATIVE 01/26/2020 2028   HGBUR NEGATIVE 01/26/2020 2028   BILIRUBINUR NEGATIVE 01/26/2020 2028   KETONESUR NEGATIVE 01/26/2020 2028   PROTEINUR 100 (A) 01/26/2020 2028   NITRITE NEGATIVE 01/26/2020 2028   LEUKOCYTESUR NEGATIVE 01/26/2020 2028   Sepsis Labs Invalid input(s): PROCALCITONIN,  WBC,  LACTICIDVEN Microbiology Recent Results (from the past 240 hour(s))  SARS Coronavirus 2 by RT PCR (hospital order, performed in [place] hospital lab) Nasopharyngeal Nasopharyngeal Swab     Status: None   Collection Time: 01/26/20 10:29 PM   Specimen: Nasopharyngeal Swab  Result Value Ref Range Status   SARS Coronavirus 2 NEGATIVE NEGATIVE Final    Comment: (NOTE) SARS-CoV-2 target nucleic acids are NOT DETECTED. The SARS-CoV-2 RNA is generally detectable in upper and lower respiratory specimens during the acute phase of infection. The lowest concentration of SARS-CoV-2 viral copies this assay can detect is 250 copies / mL. A negative result does not preclude SARS-CoV-2 infection and should not be used as the sole basis for treatment or other patient management decisions.  A negative result may occur with improper specimen collection / handling, submission of specimen other than nasopharyngeal swab, presence of viral mutation(s) within the areas targeted by this assay, and inadequate number of viral copies (<250 copies / mL). A negative result must be combined with  clinical observations, patient history, and epidemiological information. Fact Sheet for Patients:   StrictlyIdeas.no Fact Sheet for Healthcare Providers: BankingDealers.co.za This test is not yet approved or cleared  by the [place] FDA and has been authorized for detection and/or diagnosis of SARS-CoV-2 by FDA under an Emergency Use Authorization (EUA).  This EUA will remain in effect (meaning this test can be used) for the duration of the COVID-19 declaration under Section 564(b)(1) of the Act, 21 U.S.C. section 360bbb-3(b)(1), unless the authorization is terminated or revoked sooner. Performed at [facility], [place] [address], [place], [place] [address]      Time coordinating discharge:  38 minutes  SIGNED:   Georgette Shell, MD  [facility] 01/28/2020, 11:13 AM Pager   If 7PM-7AM, please contact night-coverage [email or web address] Password [email or web address]

## 2020-01-28 NOTE — Discharge Instructions (Addendum)
Check INR 01/31/2020

## 2020-01-28 NOTE — Code Documentation (Signed)
86 yo female admitted for stroke on 5/22. Pt was at her post admission baseline at 1507 today when the nurse went to go get supplies to help discharge her. Nurse returned and the patient was having trouble getting her words out and weaker on the right side. Rapid Response called and activated a Code Stroke.   Stroke Team met patient in the hallway as she was going to CT. Assessed in CT and noted to have right sided weakness and aphasia with left facial droop - NIHSS 12. CT Head completed and showed no hemorrhage per MD.  Pt brought back to [facility] [place] and remains on the cardiac monitor. Pt getting better with exam. See Stroke Assessment. Upon assessment now - pt has mild right arm and leg drift with some mild aphasia. Alert and Oriented x4. Mild left facial droop.   Per MD, worsening stroke like symptoms discussed with attending MD. Pt to stay overnight for observation. Labs draw and EEG ordered. Handoff given to [place], Therapist, sports.

## 2020-01-28 NOTE — Progress Notes (Signed)
EEG complete - results pending 

## 2020-01-28 NOTE — Progress Notes (Signed)
Summary since stroke like symptoms. Rapid response called, Assessment made by responder. CT of head completed. Neurology in to visit and assess post scan. See notes for details. Pt transported back from CT scan via bed.  Stroke certified nurse at bedside. Continue to monitor.

## 2020-01-28 NOTE — Progress Notes (Signed)
STROKE TEAM PROGRESS NOTE   INTERVAL HISTORY Her  daughter ans son in law are at the bedside.  Stroke team was reconsulted today and code stroke was called as patient was being getting ready for discharge.  Patient's daughter who was at the bedside stated that she seemed confused and had trouble getting her words out and appeared weaker on the right side.  Rapid response nurse were called who activated a code stroke.  I met the patient in CT scan which was obtained emergently and showed no acute abnormality.  Patient appeared slightly confused with some right facial droop.  Patient's effort was poor but there was some right-sided weakness particularly in the arm.  By the time she was brought up back into her room her speech was more clear and she had no leg drift but only mild right hand weakness and facial droop.  Case was discussed with medical attending Dr. Zigmund Daniel and code stroke was canceled and stat CBC CMP were ordered along with troponin.  EEG was also ordered.  Patient discharge was canceled CT angio not done due to poor baseline renal function.  Patient is on warfarin with INR 1.8. OBJECTIVE Vitals:   01/28/20 0744 01/28/20 1541 01/28/20 1556 01/28/20 1611  BP: (!) 144/77 (!) 132/97 131/70 (!) 162/85  Pulse: 88     Resp: 20 20 (!) 22 19  Temp: 98.3 F (36.8 C)     TempSrc: Axillary     SpO2: 100%     Weight:        CBC:  Recent Labs  Lab 01/26/20 1628 01/26/20 1629  WBC 4.7  --   NEUTROABS 2.3  --   HGB 13.2 13.9  HCT 41.3 41.0  MCV 107.0*  --   PLT 145*  --     Basic Metabolic Panel:  Recent Labs  Lab 01/26/20 1628 01/26/20 1629  NA 143 143  K 4.3 4.1  CL 104 107  CO2 27  --   GLUCOSE 93 89  BUN 29* 32*  CREATININE 1.97* 1.90*  CALCIUM 9.4  --     Lipid Panel:     Component Value Date/Time   CHOL 142 01/27/2020 0446   TRIG 52 01/27/2020 0446   HDL 46 01/27/2020 0446   CHOLHDL 3.1 01/27/2020 0446   VLDL 10 01/27/2020 0446   LDLCALC 86 01/27/2020 0446    HgbA1c:  Lab Results  Component Value Date   HGBA1C 6.2 (H) 01/27/2020   Urine Drug Screen:     Component Value Date/Time   LABOPIA NONE DETECTED 01/26/2020 2027   COCAINSCRNUR NONE DETECTED 01/26/2020 2027   LABBENZ NONE DETECTED 01/26/2020 2027   AMPHETMU NONE DETECTED 01/26/2020 2027   THCU NONE DETECTED 01/26/2020 2027   LABBARB NONE DETECTED 01/26/2020 2027    Alcohol Level     Component Value Date/Time   ETH <10 01/26/2020 1628    IMAGING  MR BRAIN WO CONTRAST 01/26/2020 IMPRESSION:  1. Acute lacunar type infarct extends from the posterior left corona radiata to the left motor strip in the right upper extremity representation area. No associated hemorrhage or mass effect.  2. No other acute intracranial abnormality. Underlying chronic small vessel disease otherwise stable to minimally increased since 2019.   CT HEAD CODE STROKE WO CONTRAST 01/26/2020 IMPRESSION:  Stable cerebral white matter disease since 2019. No acute intracranial hemorrhage or cortically based infarct identified. ASPECTS 10.   DG Chest Portable 1 View 01/26/2020 IMPRESSION:  Limited study due to positioning.  No acute abnormalities are noted. Mild atelectasis in the left base.  ECG - atrial fibrillation - ventricular response 92 BPM (See cardiology reading for complete details)   PHYSICAL EXAM  Temp:  [98 F (36.7 C)-98.5 F (36.9 C)] 98.3 F (36.8 C) (05/24 0744) Pulse Rate:  [72-102] 88 (05/24 0744) Resp:  [18-22] 19 (05/24 1611) BP: (131-189)/(70-104) 162/85 (05/24 1611) SpO2:  [98 %-100 %] 100 % (05/24 0744)  General - Well nourished, well developed, mildly lethargic.  Ophthalmologic - fundi not visualized due to noncooperation.  Cardiovascular - irregularly irregular heart rate and rhythm  Neuro - awake alert but mildly lethargic.  Eyes open, follows simple commands, paucity of speech however answer questions appropriately.  Oriened to place, people and month but not to year.   Naming 3/4 and able to repeat.  Mild  dysarthria.  Visual field full, no gaze preference, no gaze palsy, PERRL, slight right nasolabial fold flattening.  Tongue midline.  Muscle strength bilaterally symmetrical except right upper extremity mild pronator drift, right lower extremity foot DF 4/5.  Sensation inconsistent on exam, coordination grossly intact bilateral finger-to-nose.  Gait not tested.   ASSESSMENT/PLAN Ms. Allison Rhodes is a 86 y.o. female with history of hypertension, short term memory problems, hx of hypertensive urgency, hx of TIA, hyperlipidemia, atrial fibrillation, DVT 2002, PE 2003, on Coumadin (INR - 1.9) presenting with LE weakness and chest pain. She did not receive IV t-PA due to unknown time of onset and anticoagulation.    Stroke: Left MCA/ACA small infarct - likely due to A. fib on Coumadin with subtherapeutic INR. Transient neurological worsening on 01/28/2020 with speech difficulties and increased right-sided weakness of unknown etiology.  CT head negative for acute abnormality  Code Stroke CT Head - Stable cerebral white matter disease since 2019. No acute intracranial hemorrhage or cortically based infarct identified. ASPECTS 10.  MRI head - Acute lacunar type infarct extends from the posterior left corona radiata to the left motor strip in the right upper extremity representation area. Underlying chronic small vessel disease otherwise stable to minimally increased since 2019.   Carotid Doppler unremarkable  2D Echo EF 60 to 65%  [employer] Virus 2 - negative  LDL - 86  HgbA1c - 6.2  UDS - negative  VTE prophylaxis - warfarin  warfarin daily prior to admission, now on aspirin 325 mg daily and warfarin daily.  Recommend INR goal 2.5-3.0.  Continue aspirin 325 as bridge, once INR at goal, aspirin can be discontinued.  Not good candidate for DOAC given CKD 4  Patient counseled to be compliant with her antithrombotic medications  Ongoing aggressive stroke  risk factor management  Therapy recommendations: CIR  Disposition:  Pending  Chronic A. Fib  On Coumadin PTA  As per family, INR baseline at the lower end of the goal, commonly at 1.9-2.2  Now recommend increase INR goal to 2.5-3.0  Continue Coumadin but use aspirin 325 as bridge, once INR at goal, aspirin can be discontinued  Hypertension  Home BP meds: none   Current BP meds: hydralazine prn . Permissive hypertension (OK if <180/105) but gradually normalize in 2-3 days  . Long-term BP goal normotensive  Hyperlipidemia  Home Lipid lowering medication:  Lipitor 40 mg daily  LDL 86, goal < 70  Current lipid lowering medication: Lipitor 40 mg daily  Continue statin at discharge  Other Stroke Risk Factors  Advanced age  Hx of TIA in 2019 with unremarkable carotid Doppler and TTE - followed  [facility] Leonie [facility] at [facility]  History of DVT in 2002  History of PE in 2003  Other Active Problems  Code status - Full code  CKD stage 4 - cre 1.97->1.90 - not a good candidate for DOAC  Chest pain on admission - troponin negative  Hospital day # 2  I had a long discussion with the patient, daughter and son-in-law and Dr. Zigmund Daniel regarding her strokelike presentation.  Recommend bedrest and cautious IV hydration as well as check stat CBC, CMP and EEG.  Continue warfarin with target INR between 2 and 3.  Reassess in the morning to see if she is stable for discharge.  Greater than 50% time during this 35-minute visit was spent on counseling and coordination of care and answering questions.  Antony Contras, MD Stroke Neurology 01/28/2020 4:49 PM   To contact Stroke Continuity provider, please refer to [email or web address] After hours, contact General Neurology

## 2020-01-28 NOTE — Progress Notes (Signed)
ANTICOAGULATION CONSULT NOTE  Pharmacy Consult for warfarin Indication: atrial fibrillation  No Known Allergies  Patient Measurements: Weight: 56.2 kg (123 lb 14.4 oz)  Vital Signs: Temp: 98.3 F (36.8 C) (05/24 0744) Temp Source: Axillary (05/24 0744) BP: 144/77 (05/24 0744) Pulse Rate: 88 (05/24 0744)  Labs: Recent Labs    01/26/20 1628 01/26/20 1629 01/26/20 2016 01/27/20 0446 01/28/20 0430  HGB 13.2 13.9  --   --   --   HCT 41.3 41.0  --   --   --   PLT 145*  --   --   --   --   APTT 31  --   --   --   --   LABPROT 20.7*  --   --  17.6* 20.6*  INR 1.9*  --   --  1.5* 1.8*  CREATININE 1.97* 1.90*  --   --   --   TROPONINIHS 17  --  16  --   --     CrCl cannot be calculated (Unknown ideal weight.).  Assessment: CC/HPI: CVA  PMH: HTN, HLD, Afib, hx DVT 2002/PE 2003  Anticoag: Warfarin PTA for afib; INR 1.9 >1.5>1.8 Aspirin 325 for bridging   PTA warfarin: 3.5 mg daily    Goal of Therapy:  INR 2.5 - 3 per MD Monitor platelets by anticoagulation protocol: Yes   Plan:  Repeat Warfarin 5 mg x 1 Daily INR DC aspirin when INR at goal   Thank you Anette Guarneri, PharmD [phone] Please check AMION for all [place] numbers  01/28/2020 9:40 AM

## 2020-01-28 NOTE — TOC Transition Note (Addendum)
Transition of Care [facility]) - CM/SW Discharge Note   Patient Details  Name: Allison Rhodes MRN: [id number] Date of Birth: 1922-08-29  [facility] CM/SW Contact:  Pollie Friar, RN Phone Number: 01/28/2020, 11:56 AM   Clinical Narrative:    Family is refusing CIR and they prefer to take the patient home with [facility] services. CM provided choice and [facility] selected.  Britney with [facility] accepted the referral.  DME at home: shower seat/ elevated toilet/ walker Daughter wants a 3 in 1 for home. Zack with [employer] aware and will deliver to the room. Pt has transportation home.   1200: Daughter changed her mind and wants [facility]. CM has cancelled [facility] and will update Tommi Rumps at [place] of the referral.   9179: daughter requesting walker for home. States, they arent sure where the one is at home. Zack with [employer] made aware and will deliver to the room.   Final next level of care: Home w Home Health Services Barriers to Discharge: No Barriers Identified   Patient Goals and CMS Choice   CMS Medicare.gov Compare Post Acute Care list provided to:: Patient Represenative (must comment) Choice offered to / list presented to : Adult Children  Discharge Placement                       Discharge Plan and Services                DME Arranged: 3-N-1 DME Agency: [employer] Date DME Agency Contacted: 01/28/20   Representative spoke with at DME Agency: [place]: PT, OT, Speech Therapy HH Agency: [facility] Date [place]: 01/28/20 Time [place]: 1156 Representative spoke with at [place]: [place] ([place]) Interventions     Readmission Risk Interventions No flowsheet data found.

## 2020-01-28 NOTE — Progress Notes (Signed)
Into room to remove IV access at 1517. Pt was found having difficulty finding words. Pt seemed to be disoriented. Neuro assessment  with left facial droop was noted very minimal. Rapid called. Pt more alert upon their arrival. Dr. Zigmund Daniel called and responded. Instructions to call Neurology given. In process at this time. Continue to monitor.

## 2020-01-28 NOTE — Progress Notes (Signed)
PROGRESS NOTE    Allison Rhodes  IOX:[id number] DOB: 09-30-1921 DOA: 01/26/2020 PCP: Merrilee Seashore, MD    Brief Narrative: HPI per Dr. Willeen Cass 86 year old femalewith PMH ofHTN, HLD, a Fib on Warfarin and hx of DVT/PE presented to ED with weakness and admitted for acute lacunar infarct.   History provided by patient and daughter who is at bedside. Reports patient was in her usual state of health this morning. She took a nap this afternoon and when she woke up, she was very weak. Normally she requires minimal assistance to ambulate but her daughter had to basically hold her up and then called EMS. Weakness not localized to either side. Denies facial assymetry, speech difficulty, confusion or other complaints. Patient complained of chest pain when EMS arrived and was given Aspirin. Denies chest pain currently. Also denies headache, dizziness, fever, chills, cough, SOB, abdominal pain, nausea, vomiting, diarrhea, constipation, dysuria, hematuria, hematochezia, melena, speech difficulty, trouble eating, confusion or any other complaints.  In the QA:STMHDQQIWLNL and intermittently tachycardic otherwise stable on room air. Labs remarkable forglucose 82, INR 1.9. CBC/PTT/Ethanol level WNL. Trop neg x2. Cr 1.97. UDS neg. UA without evidence of infection. Ammonia pending.   CT Head: Stablecerebral white matter disease since 2019. No acuteintracranial hemorrhage or cortically based infarct identified.  GXQ:JJHERDE study due to positioning. No acute abnormalities are noted. Mild atelectasis in the left base. MRI Brain: 1. Acute lacunar type infarct extends from the posterior left corona radiata to the left motor strip in the right upper extremity representation area. No associated hemorrhage or mass effect.  2. No other acute intracranial abnormality. Underlying chronic small vessel disease otherwise stable to minimally increased since 2019.  Neuro consulted and gave recommendation upon  admission.  Assessment & Plan:   Principal Problem:   Lacunar infarct, acute ([place]) Active Problems:   Hypertension   Hyperlipemia   History of pulmonary embolism   CKD (chronic kidney disease)   Atrial fibrillation, chronic (HCC)   Chronic anticoagulation   Hypertensive urgency    #1 acute left lacunar infarct- Secondary to uncontrolled hypertension. MRI of the brain-acute lacunar type infarct extending from the posterior left coronary radiator to the left motor strip in the right upper extremity representation area.  No hemorrhage or mass-effect.  Underlying chronic small vessel disease stable to minimally increased since 2019. Echocardiogram-Left ventricular ejection fraction, by estimation, is 60 to 65%. The  left ventricle has normal function. Left ventricular endocardial border  not optimally defined to evaluate regional wall motion. Left ventricular  diastolic parameters are  indeterminate.   Right ventricular systolic function is low normal. The right  ventricular size is mildly enlarged. There is normal pulmonary artery  systolic pressure.   Right atrial size was mild to moderately dilated.  The mitral valve is degenerative. No evidence of mitral valve  regurgitation.   Tricuspid valve regurgitation is moderate.   The aortic valve was not well visualized. Aortic valve regurgitation  is not visualized.   The inferior vena cava is normal in size with greater than 50%  respiratory variability, suggesting right atrial pressure of 3 mmHg Vascular ultrasound no significant stenosis Chest x-ray mild left basilar atelectasis. PT recommends CIR,family wants to take her home with pt. On Coumadin prior to admission, on aspirin since admission INR 1.5 ldl 86 hbaic 6.2 Ammonia 18 Trop negative uds negative ua protinuria    Discussed with Dr. Erlinda Hong recommending INR between 2.5-3 Continue aspirin 325 mg till INR achieved at 2.5-3 then DC aspirin.  Discharge cancelled  as patient became dysarthric not following commands like she did with mild left facial droop new.she did c/o retrosternal cp.get ekg stat ct head stat labs troponin.  Stat ct no acute changes   #2 essential hypertension on hydralazine 5 mg every 6 as needed.  Blood pressure 141/98  #3 chronic atrial fibrillation on Coumadin  #4 DVT and PE on Coumadin  #5 CKD stage IIIb stable  #6 hyperlipidemia on Lipitor    Estimated body mass index is 21.27 kg/m as calculated from the following:   Height as of 05/16/18: 5\' 4"  (1.626 m).   Weight as of this encounter: 56.2 kg.  DVT prophylaxis: Coumadin Code Status: Full code Family Communication: Called patient's daughter with no response Disposition Plan:  Status is: Inpatient   Dispo: The patient is from: Home  Anticipated d/c is to: CIR  Anticipated d/c date is: Unknown  Patient currently is not medically stable to d/c.  Patient admitted with acute stroke PT recommending CIR Consultants: neuro Procedures: none Antimicrobials: none Subjective: Confused not following commands dysarthric   Objective: Vitals:   01/27/20 2346 01/28/20 0447 01/28/20 0627 01/28/20 0744  BP: (!) 186/104 (!) 189/94 (!) 159/98 (!) 144/77  Pulse: 89 76 72 88  Resp: 18 19 19 20   Temp: 98 F (36.7 C) 98 F (36.7 C)  98.3 F (36.8 C)  TempSrc: Oral Oral  Axillary  SpO2: 100% 98% 98% 100%  Weight:        Intake/Output Summary (Last 24 hours) at 01/28/2020 1529 Last data filed at 01/28/2020 0500 Gross per 24 hour  Intake 120 ml  Output 700 ml  Net -580 ml   Filed Weights   01/26/20 2301  Weight: 56.2 kg    Examination:  General exam: Appears calm and comfortable  Respiratory system: Clear to auscultation. Respiratory effort normal. Cardiovascular system: S1 & S2 heard, RRR. No JVD, murmurs, rubs, gallops or clicks. No pedal edema. Gastrointestinal system: Abdomen is nondistended, soft and nontender. No  organomegaly or masses felt. Normal bowel sounds heard. Central nervous system:confused  Extremities: Symmetric 5 x 5 power. Skin: No rashes, lesions or ulcers Psychiatry: Judgement and insight appear normal. Mood & affect appropriate.     Data Reviewed: I have personally reviewed following labs and imaging studies  CBC: Recent Labs  Lab 01/26/20 1628 01/26/20 1629  WBC 4.7  --   NEUTROABS 2.3  --   HGB 13.2 13.9  HCT 41.3 41.0  MCV 107.0*  --   PLT 145*  --    Basic Metabolic Panel: Recent Labs  Lab 01/26/20 1628 01/26/20 1629  NA 143 143  K 4.3 4.1  CL 104 107  CO2 27  --   GLUCOSE 93 89  BUN 29* 32*  CREATININE 1.97* 1.90*  CALCIUM 9.4  --    GFR: CrCl cannot be calculated (Unknown ideal weight.). Liver Function Tests: Recent Labs  Lab 01/26/20 1628  AST 31  ALT 23  ALKPHOS 47  BILITOT 1.1  PROT 7.4  ALBUMIN 3.9   No results for input(s): LIPASE, AMYLASE in the last 168 hours. Recent Labs  Lab 01/26/20 2108  AMMONIA 18   Coagulation Profile: Recent Labs  Lab 01/26/20 1628 01/27/20 0446 01/28/20 0430  INR 1.9* 1.5* 1.8*   Cardiac Enzymes: No results for input(s): CKTOTAL, CKMB, CKMBINDEX, TROPONINI in the last 168 hours. BNP (last 3 results) No results for input(s): PROBNP in the last 8760 hours. HbA1C: Recent Labs  01/27/20 0446  HGBA1C 6.2*   CBG: Recent Labs  Lab 01/26/20 1619 01/28/20 1517  GLUCAP 82 116*   Lipid Profile: Recent Labs    01/27/20 0446  CHOL 142  HDL 46  LDLCALC 86  TRIG 52  CHOLHDL 3.1   Thyroid Function Tests: No results for input(s): TSH, T4TOTAL, FREET4, T3FREE, THYROIDAB in the last 72 hours. Anemia Panel: No results for input(s): VITAMINB12, FOLATE, FERRITIN, TIBC, IRON, RETICCTPCT in the last 72 hours. Sepsis Labs: No results for input(s): PROCALCITON, LATICACIDVEN in the last 168 hours.  Recent Results (from the past 240 hour(s))  SARS Coronavirus 2 by RT PCR (hospital order, performed in  [facility] hospital lab) Nasopharyngeal Nasopharyngeal Swab     Status: None   Collection Time: 01/26/20 10:29 PM   Specimen: Nasopharyngeal Swab  Result Value Ref Range Status   SARS Coronavirus 2 NEGATIVE NEGATIVE Final    Comment: (NOTE) SARS-CoV-2 target nucleic acids are NOT DETECTED. The SARS-CoV-2 RNA is generally detectable in upper and lower respiratory specimens during the acute phase of infection. The lowest concentration of SARS-CoV-2 viral copies this assay can detect is 250 copies / mL. A negative result does not preclude SARS-CoV-2 infection and should not be used as the sole basis for treatment or other patient management decisions.  A negative result may occur with improper specimen collection / handling, submission of specimen other than nasopharyngeal swab, presence of viral mutation(s) within the areas targeted by this assay, and inadequate number of viral copies (<250 copies / mL). A negative result must be combined with clinical observations, patient history, and epidemiological information. Fact Sheet for Patients:   StrictlyIdeas.no Fact Sheet for Healthcare Providers: BankingDealers.co.za This test is not yet approved or cleared  by the [place] FDA and has been authorized for detection and/or diagnosis of SARS-CoV-2 by FDA under an Emergency Use Authorization (EUA).  This EUA will remain in effect (meaning this test can be used) for the duration of the COVID-19 declaration under Section 564(b)(1) of the Act, 21 U.S.C. section 360bbb-3(b)(1), unless the authorization is terminated or revoked sooner. Performed at [facility], [facility] [address] [place], [place] [address]          Radiology Studies: MR BRAIN WO CONTRAST  Result Date: 01/26/2020 CLINICAL DATA:  86 year old female code stroke presentation with left side weakness. EXAM: MRI HEAD WITHOUT CONTRAST TECHNIQUE: Multiplanar, multiecho pulse  sequences of the brain and surrounding structures were obtained without intravenous contrast. COMPARISON:  Head CT earlier today. Brain MRI 03/31/2018. FINDINGS: Brain: 18 mm somewhat linear area of restricted diffusion tracking from the posterior left corona radiata to the left motor strip near the right upper extremity representation area (series 5, image 46 and series 7, image 86). Faint associated T2 and FLAIR hyperintensity with no hemorrhage or mass effect. No other convincing restricted diffusion. Chronic microhemorrhages in the bilateral deep gray nuclei especially the right thalamus with some associated DWI artifact. Small chronic infarct in the left cerebellum is more apparent than in 2019 on series 10, image 5. Confluent bilateral white matter T2 and FLAIR hyperintensity appears stable. No definite chronic cortical encephalomalacia. No midline shift, mass effect, evidence of mass lesion, ventriculomegaly, extra-axial collection or acute intracranial hemorrhage. Cervicomedullary junction and pituitary are within normal limits. Vascular: Major intracranial vascular flow voids are stable since 2019. Skull and upper cervical spine: Multilevel chronic cervical spine degeneration, up to mild associated degenerative spinal stenosis (series 9, image 13). But bone marrow signal  appears normal. Sinuses/Orbits: Stable and negative. Other: Mastoids remain clear. Visible internal auditory structures appear normal. IMPRESSION: 1. Acute lacunar type infarct extends from the posterior left corona radiata to the left motor strip in the right upper extremity representation area. No associated hemorrhage or mass effect. 2. No other acute intracranial abnormality. Underlying chronic small vessel disease otherwise stable to minimally increased since 2019. Electronically Signed   By: Genevie Ann M.D.   On: 01/26/2020 18:45   DG Chest Portable 1 View  Result Date: 01/26/2020 CLINICAL DATA:  Chest pain EXAM: PORTABLE CHEST 1  VIEW COMPARISON:  March 31, 2018 FINDINGS: The study is limited due to patient rotation. Stable cardiomegaly. The hila and mediastinum are unremarkable. No pneumothorax. No nodules or masses. Mild atelectasis in the left base. No other acute abnormalities. IMPRESSION: Limited study due to positioning. No acute abnormalities are noted. Mild atelectasis in the left base. Electronically Signed   By: Dorise Bullion III M.D   On: 01/26/2020 17:14   ECHOCARDIOGRAM COMPLETE  Result Date: 01/27/2020    ECHOCARDIOGRAM REPORT   Patient Name:   IRINI LEET Date of Exam: 01/27/2020 Medical Rec #:  [id number]         Height:       64.0 in Accession #:    [id number]        Weight:       123.9 lb Date of Birth:  12/11/1921         BSA:          1.596 m Patient Age:    86 years          BP:           174/135 mmHg Patient Gender: F                 HR:           90 bpm. Exam Location:  Inpatient Procedure: 2D Echo, Cardiac Doppler and Color Doppler Indications:    Stroke 434.91 / I163.9  History:        Patient has prior history of Echocardiogram examinations, most                 recent 04/01/2018. TIA, Arrythmias:Atrial Fibrillation; Risk                 Factors:Hypertension, Dyslipidemia and Non-Smoker.  Sonographer:    Vickie Epley RDCS Referring Phys: [id number] ASHISH ARORA IMPRESSIONS  1. Left ventricular ejection fraction, by estimation, is 60 to 65%. The left ventricle has normal function. Left ventricular endocardial border not optimally defined to evaluate regional wall motion. Left ventricular diastolic parameters are indeterminate.  2. Right ventricular systolic function is low normal. The right ventricular size is mildly enlarged. There is normal pulmonary artery systolic pressure.  3. Right atrial size was mild to moderately dilated.  4. The mitral valve is degenerative. No evidence of mitral valve regurgitation.  5. Tricuspid valve regurgitation is moderate.  6. The aortic valve was not well visualized. Aortic  valve regurgitation is not visualized.  7. The inferior vena cava is normal in size with greater than 50% respiratory variability, suggesting right atrial pressure of 3 mmHg. FINDINGS  Left Ventricle: Left ventricular ejection fraction, by estimation, is 60 to 65%. The left ventricle has normal function. Left ventricular endocardial border not optimally defined to evaluate regional wall motion. The left ventricular internal cavity size was normal in size. There is no left ventricular hypertrophy. Left ventricular diastolic parameters  are indeterminate. Right Ventricle: The right ventricular size is mildly enlarged. Right vetricular wall thickness was not assessed. Right ventricular systolic function is low normal. There is normal pulmonary artery systolic pressure. The tricuspid regurgitant velocity is  2.84 m/s, and with an assumed right atrial pressure of 3 mmHg, the estimated right ventricular systolic pressure is 78.6 mmHg. Left Atrium: Left atrial size was normal in size. Right Atrium: Right atrial size was mild to moderately dilated. Pericardium: There is no evidence of pericardial effusion. Mitral Valve: The mitral valve is degenerative in appearance. There is mild thickening of the mitral valve leaflet(s). No evidence of mitral valve regurgitation. Tricuspid Valve: The tricuspid valve is grossly normal. Tricuspid valve regurgitation is moderate. Aortic Valve: The aortic valve was not well visualized. Aortic valve regurgitation is not visualized. Pulmonic Valve: The pulmonic valve was not well visualized. Pulmonic valve regurgitation is not visualized. Aorta: The aortic root is normal in size and structure. Venous: The inferior vena cava is normal in size with greater than 50% respiratory variability, suggesting right atrial pressure of 3 mmHg. IAS/Shunts: No atrial level shunt detected by color flow Doppler.  LEFT VENTRICLE PLAX 2D LVIDd:         3.00 cm LVIDs:         2.30 cm LV PW:         0.80 cm LV IVS:         0.80 cm LVOT diam:     1.90 cm LV SV:         57 LV SV Index:   36 LVOT Area:     2.84 cm  RIGHT VENTRICLE TAPSE (M-mode): 1.6 cm LEFT ATRIUM             Index       RIGHT ATRIUM           Index LA diam:        3.00 cm 1.88 cm/m  RA Area:     18.00 cm LA Vol (A2C):   41.0 ml 25.69 ml/m RA Volume:   54.80 ml  34.33 ml/m LA Vol (A4C):   34.3 ml 21.49 ml/m LA Biplane Vol: 40.0 ml 25.06 ml/m  AORTIC VALVE LVOT Vmax:   121.00 cm/s LVOT Vmean:  67.900 cm/s LVOT VTI:    0.200 m  AORTA Ao Root diam: 2.80 cm TRICUSPID VALVE TR Peak grad:   32.3 mmHg TR Vmax:        284.00 cm/s  SHUNTS Systemic VTI:  0.20 m Systemic Diam: 1.90 cm Kate Sable MD Electronically signed by Kate Sable MD Signature Date/Time: 01/27/2020/2:10:13 PM    Final    CT HEAD CODE STROKE WO CONTRAST  Result Date: 01/26/2020 CLINICAL DATA:  Code stroke. 86 year old female with left side weakness. Last seen normal noon. EXAM: CT HEAD WITHOUT CONTRAST TECHNIQUE: Contiguous axial images were obtained from the base of the skull through the vertex without intravenous contrast. COMPARISON:  Head CT and brain MRI 03/31/2018. FINDINGS: Brain: Stable cerebral volume since 2019. Stable ventricle size and configuration. No midline shift, mass effect, or evidence of intracranial mass lesion. Chronic dystrophic basal ganglia calcifications. No acute intracranial hemorrhage identified. Patchy and confluent bilateral cerebral white matter hypodensity. Stable gray-white matter differentiation throughout the brain. No cortically based acute infarct identified. Vascular: Calcified atherosclerosis at the skull base. No suspicious intracranial vascular hyperdensity. Skull: Stable. Hyperostosis of the calvarium. Sinuses/Orbits: Visualized paranasal sinuses and mastoids are stable and well pneumatized. Other: No acute orbit or scalp  soft tissue finding. ASPECTS [facility] Stroke Program Early CT Score) Total score (0-10 with 10 being normal): 10  IMPRESSION: 1. Stable cerebral white matter disease since 2019. No acute intracranial hemorrhage or cortically based infarct identified. ASPECTS 10. 2. These results were communicated to Dr. Rory Percy at 4:42 pmon 5/22/2021by text page via the [facility] messaging system. Electronically Signed   By: Genevie Ann M.D.   On: 01/26/2020 16:42   VAS US CAROTID  Result Date: 01/28/2020 Carotid Arterial Duplex Study Indications:       CVA and Weakness. Risk Factors:      Hypertension, hyperlipidemia. Other Factors:     Permanent atrial fibrillation, on Warfarin. Comparison Study:  Prior study from 03/31/18 is available for comparison Performing Technologist: Sharion Dove RVS  Examination Guidelines: A complete evaluation includes B-mode imaging, spectral Doppler, color Doppler, and power Doppler as needed of all accessible portions of each vessel. Bilateral testing is considered an integral part of a complete examination. Limited examinations for reoccurring indications may be performed as noted.  Right Carotid Findings: +----------+--------+--------+--------+------------------+------------------+           PSV cm/sEDV cm/sStenosisPlaque DescriptionComments           +----------+--------+--------+--------+------------------+------------------+ CCA Prox  76      12                                intimal thickening +----------+--------+--------+--------+------------------+------------------+ CCA Distal45      12                                intimal thickening +----------+--------+--------+--------+------------------+------------------+ ICA Prox  31      9               homogeneous       tortuous           +----------+--------+--------+--------+------------------+------------------+ ICA Distal78      15                                tortuous           +----------+--------+--------+--------+------------------+------------------+ ECA       53      18                                                    +----------+--------+--------+--------+------------------+------------------+ +----------+--------+-------+--------+-------------------+           PSV cm/sEDV cmsDescribeArm Pressure (mmHG) +----------+--------+-------+--------+-------------------+ Subclavian29                                         +----------+--------+-------+--------+-------------------+ +---------+--------+--+--------+--+ VertebralPSV cm/s46EDV cm/s10 +---------+--------+--+--------+--+  Left Carotid Findings: +----------+--------+--------+--------+------------------+------------------+           PSV cm/sEDV cm/sStenosisPlaque DescriptionComments           +----------+--------+--------+--------+------------------+------------------+ CCA Prox  51      8                                 intimal thickening +----------+--------+--------+--------+------------------+------------------+ CCA Distal50      9  homogeneous                          +----------+--------+--------+--------+------------------+------------------+ ICA Prox  30      9               focal and calcific                   +----------+--------+--------+--------+------------------+------------------+ ICA Distal40      13                                                   +----------+--------+--------+--------+------------------+------------------+ ECA       45      9                                                    +----------+--------+--------+--------+------------------+------------------+ +----------+--------+--------+--------+-------------------+           PSV cm/sEDV cm/sDescribeArm Pressure (mmHG) +----------+--------+--------+--------+-------------------+ [id number]                                          +----------+--------+--------+--------+-------------------+ +---------+--------+--+--------+-+ VertebralPSV cm/s29EDV cm/s6 +---------+--------+--+--------+-+   Summary: Right  Carotid: Velocities in the right ICA are consistent with a 1-39% stenosis. Left Carotid: Velocities in the left ICA are consistent with a 1-39% stenosis. Vertebrals:  Bilateral vertebral arteries demonstrate antegrade flow. Subclavians: Normal flow hemodynamics were seen in bilateral subclavian              arteries. *See table(s) above for measurements and observations.  Electronically signed by Antony Contras MD on 01/28/2020 at 1:44:49 PM.    Final         Scheduled Meds: . aspirin EC  325 mg Oral Daily  . atorvastatin  40 mg Oral Daily  . carvedilol  3.125 mg Oral Daily  . warfarin  5 mg Oral ONCE-1600  . Warfarin - Pharmacist Dosing Inpatient   Does not apply q1600   Continuous Infusions:   LOS: 2 days     Georgette Shell, MD  01/28/2020, 3:29 PM

## 2020-01-28 NOTE — Progress Notes (Signed)
PT Cancellation Note  Patient Details Name: Allison Rhodes MRN: [id number] DOB: 10-25-1921   Cancelled Treatment:    Reason Eval/Treat Not Completed: Medical issues which prohibited therapy.  PT arrived again to see if daughter had returned from lunch, and found stroke team in assessing pt.  Nursing reports facial strength changes, symptoms concerning enough to call the stroke team member.  Follow up when pt is medically stable.   Ramond Dial 01/28/2020, 3:28 PM   Mee Hives, PT MS Acute Rehab Dept. Number: [place] and [place]

## 2020-01-29 DIAGNOSIS — R9401 Abnormal electroencephalogram [EEG]: Secondary | ICD-10-CM

## 2020-01-29 DIAGNOSIS — I639 Cerebral infarction, unspecified: Secondary | ICD-10-CM

## 2020-01-29 LAB — URINALYSIS, ROUTINE W REFLEX MICROSCOPIC
Bilirubin Urine: NEGATIVE
Glucose, UA: NEGATIVE mg/dL
Hgb urine dipstick: NEGATIVE
Ketones, ur: NEGATIVE mg/dL
Leukocytes,Ua: NEGATIVE
Nitrite: NEGATIVE
Protein, ur: 30 mg/dL — AB
Specific Gravity, Urine: 1.009 (ref 1.005–1.030)
pH: 5 (ref 5.0–8.0)

## 2020-01-29 LAB — PROTIME-INR
INR: 2.4 — ABNORMAL HIGH (ref 0.8–1.2)
Prothrombin Time: 25.6 seconds — ABNORMAL HIGH (ref 11.4–15.2)

## 2020-01-29 MED ORDER — WARFARIN SODIUM 2.5 MG PO TABS: 2.5000 mg | ORAL_TABLET | Freq: Once | ORAL | Status: DC

## 2020-01-29 MED ORDER — WARFARIN SODIUM 2.5 MG PO TABS
2.5000 mg | ORAL_TABLET | ORAL | Status: DC
  Filled 2020-01-29: qty 1

## 2020-01-29 MED ORDER — WARFARIN SODIUM 1 MG PO TABS: ORAL_TABLET | ORAL | 1 refills | Status: AC

## 2020-01-29 NOTE — TOC Transition Note (Signed)
Transition of Care [facility]) - CM/SW Discharge Note   Patient Details  Name: Allison Rhodes MRN: [id number] Date of Birth: [birth year]  Transition of Care [facility]) CM/SW Contact:  Pollie Friar, RN Phone Number: 01/29/2020, 11:52 AM   Clinical Narrative:    Pt discharging home with daughter. She has [place] services arranged through [facility]. They will draw an INR/PT on Friday and send results to pts PCP.  DME was delivered to the room yesterday.  Family to provide transport home.   Final next level of care: Home w Home Health Services Barriers to Discharge: No Barriers Identified   Patient Goals and CMS Choice   CMS Medicare.gov Compare Post Acute Care list provided to:: Patient Represenative (must comment) Choice offered to / list presented to : Adult Children  Discharge Placement                       Discharge Plan and Services                DME Arranged: [place] rolling DME Agency: [employer] Date DME Agency Contacted: 01/28/20   Representative spoke with at DME Agency: [place]: PT, OT, Speech Therapy HH Agency: [place] Date [place]: 01/28/20 Time [place]: [phone] Representative spoke with at [place]: Tommi Rumps updated on d/c home today  Social Determinants of Health ([place]) Interventions     Readmission Risk Interventions No flowsheet data found.

## 2020-01-29 NOTE — Procedures (Signed)
Patient Name: Allison Rhodes  MRN: [id number]  Epilepsy Attending: Lora Havens  Referring Physician/Provider: Dr Antony Contras Date: 01/28/2020 Duration: 23.42 mins  Patient history: 86yo F with left MCA infarct. EEG to evaluate for seizure.  Level of alertness: awake  AEDs during EEG study: None  Technical aspects: This EEG study was done with scalp electrodes positioned according to the 10-20 International system of electrode placement. Electrical activity was acquired at a sampling rate of 500Hz  and reviewed with a high frequency filter of 70Hz  and a low frequency filter of 1Hz . EEG data were recorded continuously and digitally stored.   Description: The posterior dominant rhythm consists of 7 Hz activity of moderate voltage (25-35 uV) seen predominantly in posterior head regions, symmetric and reactive to eye opening and eye closing. Hyperventilation and photic stimulation were not performed.     ABNORMALITY - Background slow  IMPRESSION: This study is suggestive of mild diffuse encephalopathy, nonspecific etiology. No seizures or epileptiform discharges were seen throughout the recording.  Carmie Lanpher Barbra Sarks

## 2020-01-29 NOTE — Progress Notes (Signed)
Occupational Therapy Treatment Patient Details Name: Allison Rhodes MRN: [id number] DOB: 08-05-1922 Today's Date: 01/29/2020    History of present illness Pt is a 86 y.o. female with PMHx including HTN, HLD, a fib on Warfarin, hx of DVT/PE who presented to ED with weakness. MRI brain revealed left corona radiata infarct.    OT comments  Patient supine in bed and agreeable to OT/PT session. Daughter present and supportive throughout session, reports plan to take pt home with [facility] services and 24/7 assist from herself and spouse.  Pt completing transfers with min-mod assist +2 today, toileting with total assist (plus assist for standing balance), grooming at sink with min assist.  Requires increased rest breaks and cueing for sequencing, safety throughout session. VSS.  Will follow acutely. Updated dc plan.    Follow Up Recommendations  Home health OT;Supervision/Assistance - 24 hour    Equipment Recommendations  None recommended by OT    Recommendations for Other Services      Precautions / Restrictions Precautions Precautions: Fall Restrictions Weight Bearing Restrictions: No       Mobility Bed Mobility Overal bed mobility: Needs Assistance Bed Mobility: Supine to Sit     Supine to sit: Min guard;Min assist;HOB elevated     General bed mobility comments: HOB elevated; pt able to get into sitting EOB grossly with min guard for safety and then mod A required to scoot hips to EOB  Transfers Overall transfer level: Needs assistance Equipment used: Rolling walker (2 wheeled) Transfers: Sit to/from [facility] Sit to Stand: Min assist;+2 safety/equipment;From elevated surface Stand pivot transfers: Mod assist;+2 safety/equipment       General transfer comment: cues for safe hand placement; assist to stabilize RW and to steady pt while powering up into standing from EOB and BSC; mod A for stand pivot bed to [facility] due to difficulty managing RW     Balance  Overall balance assessment: Needs assistance Sitting-balance support: Feet supported;No upper extremity supported Sitting balance-Leahy Scale: Fair     Standing balance support: Bilateral upper extremity supported;During functional activity Standing balance-Leahy Scale: Poor                             ADL either performed or assessed with clinical judgement   ADL Overall ADL's : Needs assistance/impaired     Grooming: Minimal assistance;Standing;Wash/dry hands                                       Vision       Perception     Praxis      Cognition Arousal/Alertness: Awake/alert Behavior During Therapy: WFL for tasks assessed/performed Overall Cognitive Status: Impaired/Different from baseline Area of Impairment: Attention;Following commands;Awareness;Problem solving                   Current Attention Level: Sustained   Following Commands: Follows one step commands with increased time;Follows one step commands consistently   Awareness: Emergent Problem Solving: Slow processing;Difficulty sequencing;Requires verbal cues General Comments: HOH; daughter present and reports pt has more clear mentation today vs previous session, follows commands with increased time but difficulty with sequencing         Exercises     Shoulder Instructions       General Comments daughter present and supportive    Pertinent Vitals/ Pain  Pain Assessment: Faces Faces Pain Scale: Hurts little more Pain Location: L UE (chronic pain/decreased ROM L shoulder) Pain Descriptors / Indicators: Guarding;Sore Pain Intervention(s): Monitored during session;Repositioned  Home Living                                          Prior Functioning/Environment              Frequency  Min 2X/week        Progress Toward Goals  OT Goals(current goals can now be found in the care plan section)  Progress towards OT goals:  Progressing toward goals  Acute Rehab OT Goals Patient Stated Goal: per family, to increase her mobility prior to return home OT Goal Formulation: With patient/family  Plan Discharge plan needs to be updated;Frequency remains appropriate    Co-evaluation    PT/OT/SLP Co-Evaluation/Treatment: Yes Reason for Co-Treatment: For patient/therapist safety;To address functional/ADL transfers PT goals addressed during session: Mobility/safety with mobility;Balance;Proper use of DME OT goals addressed during session: ADL's and self-care      AM-PAC OT "6 Clicks" Daily Activity     Outcome Measure   Help from another person eating meals?: A Little Help from another person taking care of personal grooming?: A Little Help from another person toileting, which includes using toliet, bedpan, or urinal?: Total Help from another person bathing (including washing, rinsing, drying)?: A Lot Help from another person to put on and taking off regular upper body clothing?: A Little Help from another person to put on and taking off regular lower body clothing?: A Lot 6 Click Score: 14    End of Session Equipment Utilized During Treatment: Rolling walker;Gait belt  OT Visit Diagnosis: Other abnormalities of gait and mobility (R26.89);Muscle weakness (generalized) (M62.81);Other symptoms and signs involving cognitive function   Activity Tolerance Patient tolerated treatment well   Patient Left in chair;with call bell/phone within reach;with chair alarm set;with family/visitor present   Nurse Communication Mobility status        Time: 2831-5176 OT Time Calculation (min): 38 min  Charges: OT General Charges $OT Visit: 1 Visit OT Treatments $Self Care/Home Management : 23-37 mins  [place] Pager [phone] Office [place] 01/29/2020, 1:21 PM

## 2020-01-29 NOTE — Care Management Important Message (Signed)
Important Message  Patient Details  Name: Allison Rhodes MRN: [id number] Date of Birth: [birth year]   Medicare Important Message Given:  Yes     Orbie Pyo 01/29/2020, 3:39 PM

## 2020-01-29 NOTE — Progress Notes (Signed)
Physical Therapy Treatment Patient Details Name: Allison Rhodes MRN: [id number] DOB: 11-Jun-1922 Today's Date: 01/29/2020    History of Present Illness Pt is a 86 y.o. female with PMHx including HTN, HLD, a fib on Warfarin, hx of DVT/PE who presented to ED with weakness. MRI brain revealed left corona radiata infarct.     PT Comments    Patient seen for mobility progression.  Pt able to transfer to [facility] and ambulate 6 ft with min-mod A and RW. Daughter present and educated on use of gait belt for functional transfers and gait. Daughter reports preference for taking pt home vs rehab and will provide 24 hour assistance. Recommend HHPT for further skilled PT services to maximize independence and safety with mobility and equipment below for decreased risk of falls and caregiver burden.    Follow Up Recommendations  Home health PT;Supervision/Assistance - 24 hour     Equipment Recommendations  Rolling walker with 5" wheels;Hospital bed;Wheelchair (measurements PT);Wheelchair cushion (measurements PT)    Recommendations for Other Services       Precautions / Restrictions Precautions Precautions: Fall Restrictions Weight Bearing Restrictions: No    Mobility  Bed Mobility Overal bed mobility: Needs Assistance Bed Mobility: Supine to Sit     Supine to sit: Min guard;Min assist;HOB elevated     General bed mobility comments: HOB elevated; pt able to get into sitting EOB grossly with min guard for safety and then mod A required to scoot hips to EOB  Transfers Overall transfer level: Needs assistance Equipment used: Rolling walker (2 wheeled) Transfers: Sit to/from [facility] Sit to Stand: Min assist;+2 safety/equipment;From elevated surface Stand pivot transfers: Mod assist;+2 safety/equipment       General transfer comment: cues for safe hand placement; assist to stabilize RW and to steady pt while powering up into standing from EOB and BSC; mod A for stand  pivot bed to [facility] due to difficulty managing RW   Ambulation/Gait Ambulation/Gait assistance: Min assist;Mod assist;+2 safety/equipment Gait Distance (Feet): 6 Feet Assistive device: Rolling walker (2 wheeled) Gait Pattern/deviations: Step-through pattern;Decreased step length - right;Decreased step length - left;Trunk flexed     General Gait Details: assist for balance and managing RW; cues for upright posture, forward gaze, and sequencing    Stairs             Wheelchair Mobility    Modified Rankin (Stroke Patients Only) Modified Rankin (Stroke Patients Only) Pre-Morbid Rankin Score: Moderate disability Modified Rankin: Moderately severe disability     Balance Overall balance assessment: Needs assistance Sitting-balance support: Feet supported;No upper extremity supported Sitting balance-Leahy Scale: Fair     Standing balance support: Bilateral upper extremity supported;During functional activity Standing balance-Leahy Scale: Poor                              Cognition Arousal/Alertness: Awake/alert Behavior During Therapy: WFL for tasks assessed/performed Overall Cognitive Status: Impaired/Different from baseline Area of Impairment: Attention;Following commands;Awareness;Problem solving                   Current Attention Level: Sustained   Following Commands: Follows one step commands with increased time;Follows one step commands consistently   Awareness: Emergent Problem Solving: Slow processing;Difficulty sequencing;Requires verbal cues General Comments: HOH; daughter present and reports pt has more clear mentation today vs previous session      Exercises      General Comments  Pertinent Vitals/Pain Pain Assessment: Faces Faces Pain Scale: Hurts little more Pain Location: L UE (chronic pain/decreased ROM L shoulder) Pain Descriptors / Indicators: Guarding;Sore Pain Intervention(s): Monitored during session;Repositioned     Home Living                      Prior Function            PT Goals (current goals can now be found in the care plan section) Progress towards PT goals: Progressing toward goals    Frequency    Min 4X/week      PT Plan Discharge plan needs to be updated    Co-evaluation PT/OT/SLP Co-Evaluation/Treatment: Yes Reason for Co-Treatment: For patient/therapist safety;To address functional/ADL transfers PT goals addressed during session: Mobility/safety with mobility;Balance;Proper use of DME        AM-PAC PT "6 Clicks" Mobility   Outcome Measure  Help needed turning from your back to your side while in a flat bed without using bedrails?: A Lot Help needed moving from lying on your back to sitting on the side of a flat bed without using bedrails?: A Lot Help needed moving to and from a bed to a chair (including a wheelchair)?: A Lot Help needed standing up from a chair using your arms (e.g., wheelchair or bedside chair)?: A Little Help needed to walk in hospital room?: A Lot Help needed climbing 3-5 steps with a railing? : Total 6 Click Score: 12    End of Session Equipment Utilized During Treatment: Gait belt Activity Tolerance: Patient tolerated treatment well Patient left: with call bell/phone within reach;with family/visitor present;in chair Nurse Communication: Mobility status PT Visit Diagnosis: Muscle weakness (generalized) (M62.81);Other symptoms and signs involving the nervous system (R29.898);Other abnormalities of gait and mobility (R26.89)     Time: 7062-3762 PT Time Calculation (min) (ACUTE ONLY): 37 min  Charges:  $Gait Training: 8-22 mins                     Earney Navy, PTA Acute Rehabilitation Services Pager: [phone] Office: [phone]     Darliss Cheney 01/29/2020, 10:02 AM

## 2020-01-29 NOTE — Progress Notes (Signed)
Allison Rhodes for warfarin Indication: atrial fibrillation  No Known Allergies  Patient Measurements: Weight: 56.2 kg (123 lb 14.4 oz)  Vital Signs: Temp: 98.4 F (36.9 C) (05/25 0754) Temp Source: Oral (05/25 0754) BP: 144/85 (05/25 0754) Pulse Rate: 76 (05/25 0754)  Labs: Recent Labs    01/26/20 1628 01/26/20 1628 01/26/20 1629 01/26/20 2016 01/27/20 0446 01/28/20 0430 01/28/20 1638 01/29/20 0509  HGB 13.2   < > 13.9  --   --   --  12.9  --   HCT 41.3  --  41.0  --   --   --  38.4  --   PLT 145*  --   --   --   --   --  131*  --   APTT 31  --   --   --   --   --   --   --   LABPROT 20.7*   < >  --   --  17.6* 20.6*  --  25.6*  INR 1.9*   < >  --   --  1.5* 1.8*  --  2.4*  CREATININE 1.97*  --  1.90*  --   --   --  2.00*  --   TROPONINIHS 17  --   --  16  --   --  35*  --    < > = values in this interval not displayed.    CrCl cannot be calculated (Unknown ideal weight.).  Assessment: CC/HPI: CVA  PMH: HTN, HLD, Afib, hx DVT 2002/PE 2003  Anticoag: Warfarin PTA for afib; INR 2.4 today  Aspirin 325 for bridging   PTA warfarin: 3.5 mg daily    Goal of Therapy:  INR 2.5 - 3 per MD Monitor platelets by anticoagulation protocol: Yes   Plan:  Warfarin 2.5 mg po x 1 dose tonight (INR up quickly from 1.8 to 2.4) Resume home regimen 3.5 mg daily 5/26 Daily INR DC aspirin when INR at goal (2.5 to 3)  Thank you Anette Guarneri, PharmD [phone] Please check AMION for all [place] numbers  01/29/2020 9:35 AM

## 2020-01-29 NOTE — Progress Notes (Signed)
STROKE TEAM PROGRESS NOTE   INTERVAL HISTORY Her   daughter is at the bedside.  Patient's mental status and right-sided weakness appear to have improved back to baseline.  CT scan of the head yesterday as well as followed by MRI scan of the later yesterday evening both did not show any new infarct or acute abnormality.  Expected evolutionary changes in the left frontal infarct.  EEG done yesterday showed mild generalized slowing without definite epileptiform activity.  BMP CBC and UA also unremarkable.  Vitals:   01/29/20 0300 01/29/20 0507 01/29/20 0754 01/29/20 0858  BP: (!) 152/96 (!) 150/79 (!) 144/85   Pulse: 65 74 76   Resp: 13 18 20 10   Temp: 98.8 F (37.1 C) 98 F (36.7 C) 98.4 F (36.9 C)   TempSrc: Oral Oral Oral   SpO2: 100% 98% 96%   Weight:        CBC:  Recent Labs  Lab 01/26/20 1628 01/26/20 1628 01/26/20 1629 01/28/20 1638  WBC 4.7  --   --  6.5  NEUTROABS 2.3  --   --   --   HGB 13.2   < > 13.9 12.9  HCT 41.3   < > 41.0 38.4  MCV 107.0*  --   --  102.7*  PLT 145*  --   --  131*   < > = values in this interval not displayed.    Basic Metabolic Panel:  Recent Labs  Lab 01/26/20 1628 01/26/20 1628 01/26/20 1629 01/28/20 1638  NA 143   < > 143 133*  K 4.3   < > 4.1 5.0  CL 104   < > 107 101  CO2 27  --   --  23  GLUCOSE 93   < > 89 110*  BUN 29*   < > 32* 29*  CREATININE 1.97*   < > 1.90* 2.00*  CALCIUM 9.4  --   --  8.7*   < > = values in this interval not displayed.   Lipid Panel:     Component Value Date/Time   CHOL 142 01/27/2020 0446   TRIG 52 01/27/2020 0446   HDL 46 01/27/2020 0446   CHOLHDL 3.1 01/27/2020 0446   VLDL 10 01/27/2020 0446   LDLCALC 86 01/27/2020 0446   HgbA1c:  Lab Results  Component Value Date   HGBA1C 6.2 (H) 01/27/2020   Urine Drug Screen:     Component Value Date/Time   LABOPIA NONE DETECTED 01/26/2020 2027   COCAINSCRNUR NONE DETECTED 01/26/2020 2027   LABBENZ NONE DETECTED 01/26/2020 2027   AMPHETMU NONE  DETECTED 01/26/2020 2027   THCU NONE DETECTED 01/26/2020 2027   LABBARB NONE DETECTED 01/26/2020 2027    Alcohol Level     Component Value Date/Time   ETH <10 01/26/2020 1628    IMAGING past 24 hours MR BRAIN WO CONTRAST  Result Date: 01/28/2020 CLINICAL DATA:  Ataxia, stroke suspected. Word-finding difficulty and left facial droop. EXAM: MRI HEAD WITHOUT CONTRAST TECHNIQUE: Multiplanar, multiecho pulse sequences of the brain and surrounding structures were obtained without intravenous contrast. COMPARISON:  Head CT 01/28/2020 and MRI 01/26/2020 FINDINGS: Brain: A small acute cortical and subcortical infarct in the posterior left frontal lobe demonstrates increased T2 hyperintensity but has not significantly changed in extent from the recent prior MRI. No new infarct is identified. Chronic bilateral cerebral microhemorrhages are unchanged and predominantly involve the basal ganglia and thalami. Confluent T2 hyperintensities in the cerebral white matter bilaterally are unchanged and nonspecific  but compatible with severe chronic small vessel ischemic disease. A small chronic infarct is again noted in the left cerebellar hemisphere. Cerebral atrophy is not greater than expected for age. No mass, midline shift, or extra-axial fluid collection is identified. Vascular: Major intracranial vascular flow voids are preserved. Skull and upper cervical spine: Unremarkable bone marrow signal. Sinuses/Orbits: Bilateral cataract extraction. Paranasal sinuses and mastoid air cells are clear. Other: None. IMPRESSION: 1. Evolving small acute left frontal lobe infarct. 2. No new infarct identified since the recent prior MRI. 3. Severe chronic small vessel ischemic disease. Electronically Signed   By: Logan Bores M.D.   On: 01/28/2020 20:47   EEG adult  Result Date: 01/29/2020 Lora Havens, MD     01/29/2020 10:08 AM Patient Name: Allison Rhodes MRN: [id number] Epilepsy Attending: Lora Havens Referring  Physician/Provider: Dr Antony Contras Date: 01/28/2020 Duration: 23.42 mins Patient history: 86yo F with left MCA infarct. EEG to evaluate for seizure. Level of alertness: awake AEDs during EEG study: None Technical aspects: This EEG study was done with scalp electrodes positioned according to the 10-20 International system of electrode placement. Electrical activity was acquired at a sampling rate of 500Hz  and reviewed with a high frequency filter of 70Hz  and a low frequency filter of 1Hz . EEG data were recorded continuously and digitally stored. Description: The posterior dominant rhythm consists of 7 Hz activity of moderate voltage (25-35 uV) seen predominantly in posterior head regions, symmetric and reactive to eye opening and eye closing. Hyperventilation and photic stimulation were not performed.   ABNORMALITY - Background slow IMPRESSION: This study is suggestive of mild diffuse encephalopathy, nonspecific etiology. No seizures or epileptiform discharges were seen throughout the recording. Priyanka Barbra Sarks   CT HEAD CODE STROKE WO CONTRAST`  Result Date: 01/28/2020 CLINICAL DATA:  Code stroke. Word-finding difficulty and left facial droop. EXAM: CT HEAD WITHOUT CONTRAST TECHNIQUE: Contiguous axial images were obtained from the base of the skull through the vertex without intravenous contrast. COMPARISON:  Head CT and MRI 01/26/2020 FINDINGS: Brain: A small acute infarct primarily involving white matter in the posterior left frontal lobe was better demonstrated on MRI. No new cortically based infarct, intracranial hemorrhage, mass, midline shift, or extra-axial fluid collection is identified. Confluent hypodensities in the cerebral white matter bilaterally are similar to the prior CT and nonspecific but compatible with extensive chronic small vessel ischemic disease. Mild cerebral atrophy is not greater than expected for age. Vascular: Calcified atherosclerosis at the skull base. No hyperdense vessel. Skull:  No fracture or suspicious osseous lesion. Sinuses/Orbits: Visualized paranasal sinuses and mastoid air cells are clear. Bilateral cataract extraction. Other: None. ASPECTS [facility] Stroke Program Early CT Score) - Ganglionic level infarction (caudate, lentiform nuclei, internal capsule, insula, M1-M3 cortex): 7 - Supraganglionic infarction (M4-M6 cortex): 3 Total score (0-10 with 10 being normal): 10 IMPRESSION: 1. No evidence of acute intracranial abnormality. 2. ASPECTS is 10. 3. Extensive chronic small vessel ischemic disease. These results were communicated to Dr. Lorraine Lax at 4:16 pm on 01/28/2020 by text page via the [facility] messaging system. Electronically Signed   By: Logan Bores M.D.   On: 01/28/2020 16:18    PHYSICAL EXAM Pleasant elderly African-American frail lady not in distress. . Afebrile. Head is nontraumatic. Neck is supple without bruit.    Cardiac exam no murmur or gallop. Lungs are clear to auscultation. Distal pulses are well felt. Neurological Exam  awake alert interactive.  Eyes open, follows simple commands, paucity of speech however answer  questions appropriately.  Oriened to place, people and month but not to year.  Naming 3/4 and able to repeat.  Mild  dysarthria.  Visual field full, no gaze preference, no gaze palsy, PERRL, slight right nasolabial fold flattening.  Tongue midline.  Muscle strength bilaterally symmetrical except right upper extremity mild pronator drift and right grip and hand weakness, right lower extremity foot DF 4/5.  Sensation inconsistent on exam, coordination grossly intact bilateral finger-to-nose.  Gait not tested.  ASSESSMENT/PLAN Ms. Raegan Winders is a 86 y.o. female with history of hypertension, short term memory problems, hx of hypertensive urgency, hx of TIA, hyperlipidemia, atrial fibrillation, DVT 2002, PE 2003, on Coumadin (INR - 1.9) presenting with LE weakness and chest pain. She did not receive IV t-PA due to unknown time of onset and  anticoagulation.    Stroke: Left MCA/ACA small infarct - likely due to A. fib on Coumadin with subtherapeutic INR. Transient neurological worsening on 01/28/2020 with speech difficulties and increased right-sided weakness of unknown etiology.  CT  And MRI head negative for acute abnormality  Code Stroke CT Head - Stable cerebral white matter disease since 2019. No acute intracranial hemorrhage or cortically based infarct identified. ASPECTS 10.  MRI head - Acute lacunar type infarct extends from the posterior left corona radiata to the left motor strip in the right upper extremity representation area. Underlying chronic small vessel disease otherwise stable to minimally increased since 2019.   Carotid Doppler unremarkable  2D Echo EF 60 to 65%  [employer] Virus 2 - negative  LDL - 86  HgbA1c - 6.2  UDS - negative  EEG 01/28/20 mild gen slowing  VTE prophylaxis - warfarin  warfarin daily prior to admission, now on aspirin 325 mg daily and warfarin daily.  Recommend INR goal 2.5-3.0.  Continue aspirin 325 as bridge, once INR at goal, aspirin can be discontinued.  Not good candidate for DOAC given CKD 4  Patient counseled to be compliant with her antithrombotic medications  Ongoing aggressive stroke risk factor management  Therapy recommendations: CIR  Disposition:  Pending  Chronic A. Fib  On Coumadin PTA  As per family, INR baseline at the lower end of the goal, commonly at 1.9-2.2  Now recommend increase INR goal to 2.5-3.0  Continue Coumadin but use aspirin 325 as bridge, once INR at goal, aspirin can be discontinued  Hypertension  Home BP meds: none   Current BP meds: hydralazine prn  Permissive hypertension (OK if <180/105) but gradually normalize in 2-3 days   Long-term BP goal normotensive  Hyperlipidemia  Home Lipid lowering medication:  Lipitor 40 mg daily  LDL 86, goal < 70  Current lipid lowering medication: Lipitor 40 mg daily  Continue  statin at discharge  Other Stroke Risk Factors  Advanced age  Hx of TIA in 2019 with unremarkable carotid Doppler and TTE - followed with Dr. Leonie Man [facility]  History of DVT in 2002  History of PE in 2003  Other Active Problems  Code status - Full code  CKD stage 4 - cre 1.97->1.90 - not a good candidate for DOAC  Chest pain on admission - troponin negative    Hospital day # 3  Patient's neurological condition appears to [place] recovered back to her recent baseline.  The exact etiology for her neurological worsening is unclear but repeat brain imaging does not show definite new stroke and EEG is negative for seizure activity.  Recommend continue anticoagulation with warfarin with target INR between  2 and 3.  Continue ongoing aggressive stroke risk factor modification.  Patient may be discharged home.  Follow-up as an outpatient stroke clinic in 6 weeks.  Long discussion with the patient, daughter and Dr. Zigmund Daniel and answered questions.  Stroke team will sign off Antony Contras, MD To contact Stroke Continuity provider, please refer to [email or web address] After hours, contact General Neurology

## 2020-01-31 DIAGNOSIS — Z86711 Personal history of pulmonary embolism: Secondary | ICD-10-CM | POA: Diagnosis not present

## 2020-01-31 DIAGNOSIS — M4802 Spinal stenosis, cervical region: Secondary | ICD-10-CM | POA: Diagnosis not present

## 2020-01-31 DIAGNOSIS — K5900 Constipation, unspecified: Secondary | ICD-10-CM | POA: Diagnosis not present

## 2020-01-31 DIAGNOSIS — M852 Hyperostosis of skull: Secondary | ICD-10-CM | POA: Diagnosis not present

## 2020-01-31 DIAGNOSIS — Z8701 Personal history of pneumonia (recurrent): Secondary | ICD-10-CM | POA: Diagnosis not present

## 2020-01-31 DIAGNOSIS — N1832 Chronic kidney disease, stage 3b: Secondary | ICD-10-CM | POA: Diagnosis not present

## 2020-01-31 DIAGNOSIS — I69354 Hemiplegia and hemiparesis following cerebral infarction affecting left non-dominant side: Secondary | ICD-10-CM | POA: Diagnosis not present

## 2020-01-31 DIAGNOSIS — Z86718 Personal history of other venous thrombosis and embolism: Secondary | ICD-10-CM | POA: Diagnosis not present

## 2020-01-31 DIAGNOSIS — G238 Other specified degenerative diseases of basal ganglia: Secondary | ICD-10-CM | POA: Diagnosis not present

## 2020-01-31 DIAGNOSIS — M810 Age-related osteoporosis without current pathological fracture: Secondary | ICD-10-CM | POA: Diagnosis not present

## 2020-01-31 DIAGNOSIS — M40209 Unspecified kyphosis, site unspecified: Secondary | ICD-10-CM | POA: Diagnosis not present

## 2020-01-31 DIAGNOSIS — Z9181 History of falling: Secondary | ICD-10-CM | POA: Diagnosis not present

## 2020-01-31 DIAGNOSIS — I4821 Permanent atrial fibrillation: Secondary | ICD-10-CM | POA: Diagnosis not present

## 2020-01-31 DIAGNOSIS — I1310 Hypertensive heart and chronic kidney disease without heart failure, with stage 1 through stage 4 chronic kidney disease, or unspecified chronic kidney disease: Secondary | ICD-10-CM | POA: Diagnosis not present

## 2020-01-31 DIAGNOSIS — I69318 Other symptoms and signs involving cognitive functions following cerebral infarction: Secondary | ICD-10-CM | POA: Diagnosis not present

## 2020-01-31 DIAGNOSIS — H9193 Unspecified hearing loss, bilateral: Secondary | ICD-10-CM | POA: Diagnosis not present

## 2020-01-31 DIAGNOSIS — Z7901 Long term (current) use of anticoagulants: Secondary | ICD-10-CM | POA: Diagnosis not present

## 2020-01-31 DIAGNOSIS — R471 Dysarthria and anarthria: Secondary | ICD-10-CM | POA: Diagnosis not present

## 2020-01-31 DIAGNOSIS — E785 Hyperlipidemia, unspecified: Secondary | ICD-10-CM | POA: Diagnosis not present

## 2020-01-31 DIAGNOSIS — Z7982 Long term (current) use of aspirin: Secondary | ICD-10-CM | POA: Diagnosis not present

## 2020-01-31 DIAGNOSIS — M19019 Primary osteoarthritis, unspecified shoulder: Secondary | ICD-10-CM | POA: Diagnosis not present

## 2020-01-31 DIAGNOSIS — I081 Rheumatic disorders of both mitral and tricuspid valves: Secondary | ICD-10-CM | POA: Diagnosis not present

## 2020-01-31 DIAGNOSIS — J9811 Atelectasis: Secondary | ICD-10-CM | POA: Diagnosis not present

## 2020-02-01 DIAGNOSIS — I4821 Permanent atrial fibrillation: Secondary | ICD-10-CM | POA: Diagnosis not present

## 2020-02-01 DIAGNOSIS — R471 Dysarthria and anarthria: Secondary | ICD-10-CM | POA: Diagnosis not present

## 2020-02-01 DIAGNOSIS — I1310 Hypertensive heart and chronic kidney disease without heart failure, with stage 1 through stage 4 chronic kidney disease, or unspecified chronic kidney disease: Secondary | ICD-10-CM | POA: Diagnosis not present

## 2020-02-01 DIAGNOSIS — I69318 Other symptoms and signs involving cognitive functions following cerebral infarction: Secondary | ICD-10-CM | POA: Diagnosis not present

## 2020-02-01 DIAGNOSIS — N1832 Chronic kidney disease, stage 3b: Secondary | ICD-10-CM | POA: Diagnosis not present

## 2020-02-01 DIAGNOSIS — I69354 Hemiplegia and hemiparesis following cerebral infarction affecting left non-dominant side: Secondary | ICD-10-CM | POA: Diagnosis not present

## 2020-02-05 ENCOUNTER — Emergency Department (HOSPITAL_COMMUNITY): Payer: Medicare Other

## 2020-02-05 ENCOUNTER — Emergency Department (HOSPITAL_COMMUNITY)
Admission: EM | Admit: 2020-02-05 | Discharge: 2020-02-05 | Disposition: A | Discharge status: Home / Self Care | Payer: Medicare Other | Attending: Emergency Medicine | Admitting: Emergency Medicine

## 2020-02-05 ENCOUNTER — Encounter (HOSPITAL_COMMUNITY): Payer: Self-pay

## 2020-02-05 DIAGNOSIS — R072 Precordial pain: Secondary | ICD-10-CM | POA: Diagnosis not present

## 2020-02-05 DIAGNOSIS — I69354 Hemiplegia and hemiparesis following cerebral infarction affecting left non-dominant side: Secondary | ICD-10-CM | POA: Diagnosis not present

## 2020-02-05 DIAGNOSIS — R0789 Other chest pain: Secondary | ICD-10-CM | POA: Insufficient documentation

## 2020-02-05 DIAGNOSIS — I4891 Unspecified atrial fibrillation: Secondary | ICD-10-CM | POA: Diagnosis not present

## 2020-02-05 DIAGNOSIS — R471 Dysarthria and anarthria: Secondary | ICD-10-CM | POA: Diagnosis not present

## 2020-02-05 DIAGNOSIS — N1832 Chronic kidney disease, stage 3b: Secondary | ICD-10-CM | POA: Diagnosis not present

## 2020-02-05 DIAGNOSIS — N183 Chronic kidney disease, stage 3 unspecified: Secondary | ICD-10-CM | POA: Insufficient documentation

## 2020-02-05 DIAGNOSIS — I10 Essential (primary) hypertension: Secondary | ICD-10-CM | POA: Diagnosis not present

## 2020-02-05 DIAGNOSIS — I129 Hypertensive chronic kidney disease with stage 1 through stage 4 chronic kidney disease, or unspecified chronic kidney disease: Secondary | ICD-10-CM | POA: Diagnosis not present

## 2020-02-05 DIAGNOSIS — I69318 Other symptoms and signs involving cognitive functions following cerebral infarction: Secondary | ICD-10-CM | POA: Diagnosis not present

## 2020-02-05 DIAGNOSIS — Z79899 Other long term (current) drug therapy: Secondary | ICD-10-CM | POA: Diagnosis not present

## 2020-02-05 DIAGNOSIS — I482 Chronic atrial fibrillation, unspecified: Secondary | ICD-10-CM | POA: Diagnosis not present

## 2020-02-05 DIAGNOSIS — I4821 Permanent atrial fibrillation: Secondary | ICD-10-CM | POA: Diagnosis not present

## 2020-02-05 DIAGNOSIS — R079 Chest pain, unspecified: Secondary | ICD-10-CM | POA: Diagnosis not present

## 2020-02-05 DIAGNOSIS — I1310 Hypertensive heart and chronic kidney disease without heart failure, with stage 1 through stage 4 chronic kidney disease, or unspecified chronic kidney disease: Secondary | ICD-10-CM | POA: Diagnosis not present

## 2020-02-05 LAB — BASIC METABOLIC PANEL
Anion gap: 14 (ref 5–15)
BUN: 35 mg/dL — ABNORMAL HIGH (ref 8–23)
CO2: 21 mmol/L — ABNORMAL LOW (ref 22–32)
Calcium: 8.8 mg/dL — ABNORMAL LOW (ref 8.9–10.3)
Chloride: 99 mmol/L (ref 98–111)
Creatinine, Ser: 1.89 mg/dL — ABNORMAL HIGH (ref 0.44–1.00)
GFR calc Af Amer: 26 mL/min — ABNORMAL LOW (ref >60)
GFR calc non Af Amer: 22 mL/min — ABNORMAL LOW (ref >60)
Glucose, Bld: 117 mg/dL — ABNORMAL HIGH (ref 70–99)
Potassium: 4.5 mmol/L (ref 3.5–5.1)
Sodium: 134 mmol/L — ABNORMAL LOW (ref 135–145)

## 2020-02-05 LAB — CBC WITH DIFFERENTIAL/PLATELET
Abs Immature Granulocytes: 0.02 10*3/uL (ref 0.00–0.07)
Basophils Absolute: 0 10*3/uL (ref 0.0–0.1)
Basophils Relative: 1 %
Eosinophils Absolute: 0.1 10*3/uL (ref 0.0–0.5)
Eosinophils Relative: 1 %
HCT: 38.6 % (ref 36.0–46.0)
Hemoglobin: 12.5 g/dL (ref 12.0–15.0)
Immature Granulocytes: 0 %
Lymphocytes Relative: 30 %
Lymphs Abs: 1.6 10*3/uL (ref 0.7–4.0)
MCH: 33.8 pg (ref 26.0–34.0)
MCHC: 32.4 g/dL (ref 30.0–36.0)
MCV: 104.3 fL — ABNORMAL HIGH (ref 80.0–100.0)
Monocytes Absolute: 1 10*3/uL (ref 0.1–1.0)
Monocytes Relative: 17 %
Neutro Abs: 2.8 10*3/uL (ref 1.7–7.7)
Neutrophils Relative %: 51 %
Platelets: 181 10*3/uL (ref 150–400)
RBC: 3.7 MIL/uL — ABNORMAL LOW (ref 3.87–5.11)
RDW: 12.9 % (ref 11.5–15.5)
WBC: 5.5 10*3/uL (ref 4.0–10.5)
nRBC: 0 % (ref 0.0–0.2)

## 2020-02-05 LAB — TROPONIN I (HIGH SENSITIVITY): Troponin I (High Sensitivity): 18 ng/L — ABNORMAL HIGH (ref <18)

## 2020-02-05 MED ORDER — ALUM & MAG HYDROXIDE-SIMETH 200-200-20 MG/5ML PO SUSP
30.0000 mL | Freq: Once | ORAL | Status: AC
  Administered 2020-02-05: 30 mL via ORAL
  Filled 2020-02-05: qty 30

## 2020-02-05 NOTE — ED Provider Notes (Signed)
[facility] EMERGENCY DEPARTMENT Provider Note   CSN: [id number] Arrival date & time: 02/05/20  2055     History Chief Complaint  Patient presents with  . Hypertension    Allison Rhodes is a 86 y.o. female.  86 yo F with with chief complaints of chest pain.  This was noted earlier today after she is leaning over trying to sleep.  She has trouble describing the pain.  She is unsure how long it lasted.  Resolved now.  Mild shortness of breath with it.  Was known to be hypertensive events of the family became concerned and brought her here for evaluation.  No history of MI.  Recently had a stroke and was just in the hospital for a week.  The history is provided by the patient.  Hypertension Associated symptoms include chest pain. Pertinent negatives include no headaches and no shortness of breath.  Chest Pain Pain location:  Substernal area Pain quality: aching   Pain radiates to:  Does not radiate Pain severity:  Moderate Onset quality:  Gradual Duration: unsure likely less than an hour. Timing:  Constant Progression:  Worsening Chronicity:  New Relieved by:  Nothing Worsened by:  Certain positions Ineffective treatments:  None tried Associated symptoms: no dizziness, no fever, no headache, no nausea, no palpitations, no shortness of breath and no vomiting        Past Medical History:  Diagnosis Date  . Arthritis    "qwhere" (03/31/2018)  . Chronic atrial fibrillation ([place])   . CKD (chronic kidney disease), stage III   . DVT (deep venous thrombosis) ([place]) 2002   ?LLE  . Heart murmur   . Hyperlipidemia   . Hypertension   . Osteoporosis   . Pneumonia 2018  . Pulmonary embolism ([place]) 2003  . Stroke [facility]) 03/31/2018   "unsteady on her feet" (03/31/2018)    Patient Active Problem List   Diagnosis Date Noted  . Lacunar infarct, acute ([place]) 01/26/2020  . Atrial fibrillation, chronic ([place]) 01/26/2020  . Chronic anticoagulation 01/26/2020  .  Hypertensive urgency 01/26/2020  . TIA (transient ischemic attack) 03/31/2018  . Hypertension 03/31/2018  . Hyperlipemia 03/31/2018  . History of pulmonary embolism 03/31/2018  . CKD (chronic kidney disease) 03/31/2018    Past Surgical History:  Procedure Laterality Date  . CATARACT EXTRACTION W/ INTRAOCULAR LENS  IMPLANT, BILATERAL Bilateral      OB History   No obstetric history on file.     Family History  Problem Relation Age of Onset  . Breast cancer Neg Hx     Social History   Tobacco Use  . Smoking status: Never Smoker  . Smokeless tobacco: Never Used  Substance Use Topics  . Alcohol use: Never  . Drug use: Never    Home Medications Prior to Admission medications   Medication Sig Start Date End Date Taking? Authorizing Provider  atorvastatin (LIPITOR) 40 MG tablet Take 1 tablet (40 mg total) by mouth daily. 04/01/18   Georgette Shell, MD  carvedilol (COREG) 3.125 MG tablet Take 1 tablet (3.125 mg total) by mouth daily. 01/28/20   Georgette Shell, MD  Cyanocobalamin (VITAMIN B 12 PO) Take 1 tablet by mouth daily.    [provider]  diclofenac sodium (VOLTAREN) 1 % GEL Apply 4 g topically 3 (three) times daily as needed for pain. 03/21/18   [provider]  fluticasone (FLONASE) 50 MCG/ACT nasal spray Place 1 spray into both nostrils daily as needed for allergies  or rhinitis.    [provider]  hydrochlorothiazide (HYDRODIURIL) 25 MG tablet Take 25 mg by mouth daily. 02/15/18   [provider]  loratadine (CLARITIN) 10 MG tablet Take 10 mg by mouth daily as needed for allergies.    [provider]  Multiple Vitamins-Minerals (CENTRUM ADULTS PO) Take 1 tablet by mouth daily.    [provider]  Vitamin D, Ergocalciferol, 2000 units CAPS Take 1 tablet by mouth daily.    [provider]  warfarin (JANTOVEN) 1 MG tablet Take 2.5 mg on 01/29/2020 Then 3.5 mg starting 01/30/2020 Check INR 01/31/2020  01/29/20   Georgette Shell, MD    Allergies    Patient has no known allergies.  Review of Systems   Review of Systems  Constitutional: Negative for chills and fever.  HENT: Negative for congestion and rhinorrhea.   Eyes: Negative for redness and visual disturbance.  Respiratory: Negative for shortness of breath and wheezing.   Cardiovascular: Positive for chest pain. Negative for palpitations.  Gastrointestinal: Negative for nausea and vomiting.  Genitourinary: Negative for dysuria and urgency.  Musculoskeletal: Negative for arthralgias and myalgias.  Skin: Negative for pallor and wound.  Neurological: Negative for dizziness and headaches.    Physical Exam Updated Vital Signs BP (!) 183/90   Pulse 71   Temp 98.1 F (36.7 C)   Resp 16   SpO2 99%   Physical Exam Vitals and nursing note reviewed.  Constitutional:      General: She is not in acute distress.    Appearance: She is well-developed. She is not diaphoretic.  HENT:     Head: Normocephalic and atraumatic.  Eyes:     Pupils: Pupils are equal, round, and reactive to light.  Cardiovascular:     Rate and Rhythm: Normal rate and regular rhythm.     Heart sounds: No murmur. No friction rub. No gallop.   Pulmonary:     Effort: Pulmonary effort is normal.     Breath sounds: No wheezing or rales.  Abdominal:     General: There is no distension.     Palpations: Abdomen is soft.     Tenderness: There is no abdominal tenderness.  Musculoskeletal:        General: No tenderness.     Cervical back: Normal range of motion and neck supple.  Skin:    General: Skin is warm and dry.  Neurological:     Mental Status: She is alert and oriented to person, place, and time.  Psychiatric:        Behavior: Behavior normal.     ED Results / Procedures / Treatments   Labs (all labs ordered are listed, but only abnormal results are displayed) Labs Reviewed  CBC WITH DIFFERENTIAL/PLATELET - Abnormal; Notable for the  following components:      Result Value   RBC 3.70 (*)    MCV 104.3 (*)    All other components within normal limits  BASIC METABOLIC PANEL - Abnormal; Notable for the following components:   Sodium 134 (*)    CO2 21 (*)    Glucose, Bld 117 (*)    BUN 35 (*)    Creatinine, Ser 1.89 (*)    Calcium 8.8 (*)    GFR calc non Af Amer 22 (*)    GFR calc Af Amer 26 (*)    All other components within normal limits  TROPONIN I (HIGH SENSITIVITY) - Abnormal; Notable for the following components:   Troponin I (  High Sensitivity) 18 (*)    All other components within normal limits  TROPONIN I (HIGH SENSITIVITY)    EKG None  Radiology DG Chest 2 View  Result Date: 02/05/2020 CLINICAL DATA:  Chest pain EXAM: CHEST - 2 VIEW COMPARISON:  01/26/2020 FINDINGS: Cardiomegaly. Bibasilar atelectasis or infiltrates, increasing since prior study. No overt edema. No effusions. No acute bony abnormality. IMPRESSION: Increasing bibasilar atelectasis or infiltrates. Electronically Signed   By: Rolm Baptise M.D.   On: 02/05/2020 21:55    Procedures Procedures (including critical care time)  Medications Ordered in ED Medications  alum & mag hydroxide-simeth (MAALOX/MYLANTA) 200-200-20 MG/5ML suspension 30 mL (30 mLs Oral Given 02/05/20 2152)    ED Course  I have reviewed the triage vital signs and the nursing notes.  Pertinent labs & imaging results that were available during my care of the patient were reviewed by me and considered in my medical decision making (see chart for details).    MDM Rules/Calculators/A&P                      86 yo F with a chief complaints of chest pain.  This happened earlier this evening.  Does not sound like it lasted very long patient is somewhat demented and has trouble providing much history.  She was hypertensive during the event which concerned the family.  Recently had a stroke.  Will obtain a delta troponin.  Chest x-ray.  Reassess.  labwork with mildly elevated  trop, lower than last check when she was in the hospital.  Discussed delta, family declining, will follow up with pcp.    11:28 PM:  I have discussed the diagnosis/risks/treatment options with the patient and family and believe the pt to be eligible for discharge home to follow-up with PCP. We also discussed returning to the ED immediately if new or worsening sx occur. We discussed the sx which are most concerning (e.g., sudden worsening pain, fever, inability to tolerate by mouth) that necessitate immediate return. Medications administered to the patient during their visit and any new prescriptions provided to the patient are listed below.  Medications given during this visit Medications  alum & mag hydroxide-simeth (MAALOX/MYLANTA) 200-200-20 MG/5ML suspension 30 mL (30 mLs Oral Given 02/05/20 2152)     The patient appears reasonably screen and/or stabilized for discharge and I doubt any other medical condition or other [facility] requiring further screening, evaluation, or treatment in the ED at this time prior to discharge.   Final Clinical Impression(s) / ED Diagnoses Final diagnoses:  Chest pain in adult    Rx / DC Orders ED Discharge Orders    None       Deno Etienne, DO 02/05/20 2328

## 2020-02-05 NOTE — Discharge Instructions (Signed)
Try zantac or pepcid twice a day.  Try to avoid things that may make this worse, most commonly these are spicy foods tomato based products fatty foods chocolate and peppermint.  Alcohol and tobacco can also make this worse.  Return to the emergency department for sudden worsening pain fever or inability to eat or drink. ° °

## 2020-02-05 NOTE — ED Triage Notes (Signed)
Pt comes via [facility] for hypertension. Hx of stroke last month with minor R sided deficits. Complaint with BP medications

## 2020-02-06 DIAGNOSIS — I1310 Hypertensive heart and chronic kidney disease without heart failure, with stage 1 through stage 4 chronic kidney disease, or unspecified chronic kidney disease: Secondary | ICD-10-CM | POA: Diagnosis not present

## 2020-02-06 DIAGNOSIS — N1832 Chronic kidney disease, stage 3b: Secondary | ICD-10-CM | POA: Diagnosis not present

## 2020-02-06 DIAGNOSIS — I69318 Other symptoms and signs involving cognitive functions following cerebral infarction: Secondary | ICD-10-CM | POA: Diagnosis not present

## 2020-02-06 DIAGNOSIS — R471 Dysarthria and anarthria: Secondary | ICD-10-CM | POA: Diagnosis not present

## 2020-02-06 DIAGNOSIS — I69354 Hemiplegia and hemiparesis following cerebral infarction affecting left non-dominant side: Secondary | ICD-10-CM | POA: Diagnosis not present

## 2020-02-06 DIAGNOSIS — I4821 Permanent atrial fibrillation: Secondary | ICD-10-CM | POA: Diagnosis not present

## 2020-02-08 DIAGNOSIS — I69354 Hemiplegia and hemiparesis following cerebral infarction affecting left non-dominant side: Secondary | ICD-10-CM | POA: Diagnosis not present

## 2020-02-08 DIAGNOSIS — I4821 Permanent atrial fibrillation: Secondary | ICD-10-CM | POA: Diagnosis not present

## 2020-02-08 DIAGNOSIS — I1310 Hypertensive heart and chronic kidney disease without heart failure, with stage 1 through stage 4 chronic kidney disease, or unspecified chronic kidney disease: Secondary | ICD-10-CM | POA: Diagnosis not present

## 2020-02-08 DIAGNOSIS — R471 Dysarthria and anarthria: Secondary | ICD-10-CM | POA: Diagnosis not present

## 2020-02-08 DIAGNOSIS — I69318 Other symptoms and signs involving cognitive functions following cerebral infarction: Secondary | ICD-10-CM | POA: Diagnosis not present

## 2020-02-08 DIAGNOSIS — N1832 Chronic kidney disease, stage 3b: Secondary | ICD-10-CM | POA: Diagnosis not present

## 2020-02-11 DIAGNOSIS — I1310 Hypertensive heart and chronic kidney disease without heart failure, with stage 1 through stage 4 chronic kidney disease, or unspecified chronic kidney disease: Secondary | ICD-10-CM | POA: Diagnosis not present

## 2020-02-11 DIAGNOSIS — I69318 Other symptoms and signs involving cognitive functions following cerebral infarction: Secondary | ICD-10-CM | POA: Diagnosis not present

## 2020-02-11 DIAGNOSIS — I4821 Permanent atrial fibrillation: Secondary | ICD-10-CM | POA: Diagnosis not present

## 2020-02-11 DIAGNOSIS — N1832 Chronic kidney disease, stage 3b: Secondary | ICD-10-CM | POA: Diagnosis not present

## 2020-02-11 DIAGNOSIS — R471 Dysarthria and anarthria: Secondary | ICD-10-CM | POA: Diagnosis not present

## 2020-02-11 DIAGNOSIS — I69354 Hemiplegia and hemiparesis following cerebral infarction affecting left non-dominant side: Secondary | ICD-10-CM | POA: Diagnosis not present

## 2020-02-13 DIAGNOSIS — N184 Chronic kidney disease, stage 4 (severe): Secondary | ICD-10-CM | POA: Diagnosis not present

## 2020-02-13 DIAGNOSIS — I129 Hypertensive chronic kidney disease with stage 1 through stage 4 chronic kidney disease, or unspecified chronic kidney disease: Secondary | ICD-10-CM | POA: Diagnosis not present

## 2020-02-13 DIAGNOSIS — R413 Other amnesia: Secondary | ICD-10-CM | POA: Diagnosis not present

## 2020-02-13 DIAGNOSIS — I6381 Other cerebral infarction due to occlusion or stenosis of small artery: Secondary | ICD-10-CM | POA: Diagnosis not present

## 2020-02-13 DIAGNOSIS — G459 Transient cerebral ischemic attack, unspecified: Secondary | ICD-10-CM | POA: Diagnosis not present

## 2020-02-13 DIAGNOSIS — Z7901 Long term (current) use of anticoagulants: Secondary | ICD-10-CM | POA: Diagnosis not present

## 2020-02-14 DIAGNOSIS — N1832 Chronic kidney disease, stage 3b: Secondary | ICD-10-CM | POA: Diagnosis not present

## 2020-02-14 DIAGNOSIS — I1310 Hypertensive heart and chronic kidney disease without heart failure, with stage 1 through stage 4 chronic kidney disease, or unspecified chronic kidney disease: Secondary | ICD-10-CM | POA: Diagnosis not present

## 2020-02-14 DIAGNOSIS — R471 Dysarthria and anarthria: Secondary | ICD-10-CM | POA: Diagnosis not present

## 2020-02-14 DIAGNOSIS — I69354 Hemiplegia and hemiparesis following cerebral infarction affecting left non-dominant side: Secondary | ICD-10-CM | POA: Diagnosis not present

## 2020-02-14 DIAGNOSIS — I69318 Other symptoms and signs involving cognitive functions following cerebral infarction: Secondary | ICD-10-CM | POA: Diagnosis not present

## 2020-02-14 DIAGNOSIS — I4821 Permanent atrial fibrillation: Secondary | ICD-10-CM | POA: Diagnosis not present

## 2020-02-19 DIAGNOSIS — I4821 Permanent atrial fibrillation: Secondary | ICD-10-CM | POA: Diagnosis not present

## 2020-02-19 DIAGNOSIS — R471 Dysarthria and anarthria: Secondary | ICD-10-CM | POA: Diagnosis not present

## 2020-02-19 DIAGNOSIS — I69318 Other symptoms and signs involving cognitive functions following cerebral infarction: Secondary | ICD-10-CM | POA: Diagnosis not present

## 2020-02-19 DIAGNOSIS — I1310 Hypertensive heart and chronic kidney disease without heart failure, with stage 1 through stage 4 chronic kidney disease, or unspecified chronic kidney disease: Secondary | ICD-10-CM | POA: Diagnosis not present

## 2020-02-19 DIAGNOSIS — I69354 Hemiplegia and hemiparesis following cerebral infarction affecting left non-dominant side: Secondary | ICD-10-CM | POA: Diagnosis not present

## 2020-02-19 DIAGNOSIS — N1832 Chronic kidney disease, stage 3b: Secondary | ICD-10-CM | POA: Diagnosis not present

## 2020-02-20 DIAGNOSIS — N1832 Chronic kidney disease, stage 3b: Secondary | ICD-10-CM | POA: Diagnosis not present

## 2020-02-20 DIAGNOSIS — I1310 Hypertensive heart and chronic kidney disease without heart failure, with stage 1 through stage 4 chronic kidney disease, or unspecified chronic kidney disease: Secondary | ICD-10-CM | POA: Diagnosis not present

## 2020-02-20 DIAGNOSIS — I69354 Hemiplegia and hemiparesis following cerebral infarction affecting left non-dominant side: Secondary | ICD-10-CM | POA: Diagnosis not present

## 2020-02-20 DIAGNOSIS — I4821 Permanent atrial fibrillation: Secondary | ICD-10-CM | POA: Diagnosis not present

## 2020-02-20 DIAGNOSIS — R471 Dysarthria and anarthria: Secondary | ICD-10-CM | POA: Diagnosis not present

## 2020-02-20 DIAGNOSIS — I69318 Other symptoms and signs involving cognitive functions following cerebral infarction: Secondary | ICD-10-CM | POA: Diagnosis not present

## 2020-03-06 DIAGNOSIS — Z86711 Personal history of pulmonary embolism: Secondary | ICD-10-CM | POA: Diagnosis not present

## 2020-03-06 DIAGNOSIS — Z7901 Long term (current) use of anticoagulants: Secondary | ICD-10-CM | POA: Diagnosis not present

## 2020-03-06 DIAGNOSIS — Z8673 Personal history of transient ischemic attack (TIA), and cerebral infarction without residual deficits: Secondary | ICD-10-CM | POA: Diagnosis not present

## 2020-03-18 ENCOUNTER — Ambulatory Visit (INDEPENDENT_AMBULATORY_CARE_PROVIDER_SITE_OTHER): Payer: Medicare Other | Admitting: Neurology

## 2020-03-18 ENCOUNTER — Encounter: Payer: Self-pay | Admitting: Neurology

## 2020-03-18 VITALS — BP 140/86 | HR 62 | Ht 63.0 in | Wt 125.0 lb

## 2020-03-18 DIAGNOSIS — I6381 Other cerebral infarction due to occlusion or stenosis of small artery: Secondary | ICD-10-CM | POA: Diagnosis not present

## 2020-03-18 NOTE — Progress Notes (Signed)
[facility] [address] [place]. [place] [address] [phone]       OFFICE FOLLOW-UP NOTE  Allison Rhodes Date of Birth:  [birth year] Medical Record Number:  [id number]   HPI: Allison Rhodes is a pleasant 86 year old African-American lady seen today for initial office follow-up visit following hospital consultation for stroke in May 2021. She is accompanied by her daughter Allison Rhodes. History is obtained from them, review of electronic medical records and I personally reviewed imaging films in PACS. She has past medical history of hypertension, hyperlipidemia, atrial fibrillation, long-term warfarin and history of DVT/pulmonary embolism who presented to [facility] emergency room on 01/26/2020 with generalized weakness and difficulty walking. CT scan of the head on admission showed age-related white matter changes with no acute abnormality. Lab work suggested INR suboptimal at 1.9. MRI scan showed left posterior frontal subcortical large lacunar infarct. Carotid ultrasound showed no significant extracranial stenosis. Intracranial vascular imaging was not done. 2D echo showed normal ejection fraction. Urine drug screen was negative. LDL cholesterol was 86 mg percent. Hemoglobin A1c was 6.2. EEG showed mild generalized slowing. Patient was seen by physical occupational therapy. Coumadin dose was optimized. Patient had previously had a TIA in 2019 as well. She has been getting home physical and occupational therapy which she has just finished. She is recovered and is able to walk with a walker but walks only short distances. She is getting stronger every day. Her daughter is a Insurance underwriter and has been working with her every day. Patient has noted some increased drooling but she has no trouble swallowing. She is tolerating warfarin well and an INR does tend to fluctuate and last INR was elevated at 5.0. Blood pressure is well controlled today it is 140/86. ROS:   14 system review  of systems is positive for shoulder pain, weakness, gait difficulty, decreased hearing, bruising and all other systems negative  PMH:  Past Medical History:  Diagnosis Date  . Arthritis    "qwhere" (03/31/2018)  . Chronic atrial fibrillation ([place])   . CKD (chronic kidney disease), stage III   . DVT (deep venous thrombosis) ([place]) 2002   ?LLE  . Heart murmur   . Hyperlipidemia   . Hypertension   . Osteoporosis   . Pneumonia 2018  . Pulmonary embolism ([place]) 2003  . Stroke [facility]) 03/31/2018   "unsteady on her feet" (03/31/2018)    Social History:  Social History   Socioeconomic History  . Marital status: Widowed    Spouse name: Not on file  . Number of children: Not on file  . Years of education: Not on file  . Highest education level: Not on file  Occupational History  . Not on file  Tobacco Use  . Smoking status: Never Smoker  . Smokeless tobacco: Never Used  Vaping Use  . Vaping Use: Never used  Substance and Sexual Activity  . Alcohol use: Never  . Drug use: Never  . Sexual activity: Not Currently  Other Topics Concern  . Not on file  Social History Narrative  . Not on file   Social Determinants of Health   Financial Resource Strain:   . Difficulty of Paying Living Expenses:   Food Insecurity:   . Worried About Charity fundraiser in the Last Year:   . Arboriculturist in the Last Year:   Transportation Needs:   . Film/video editor (Medical):   Marland Kitchen Lack of Transportation (Non-Medical):   Physical Activity:   .  Days of Exercise per Week:   . Minutes of Exercise per Session:   Stress:   . Feeling of Stress :   Social Connections:   . Frequency of Communication with Friends and Family:   . Frequency of Social Gatherings with Friends and Family:   . Attends Religious Services:   . Active Member of Clubs or Organizations:   . Attends Archivist Meetings:   Marland Kitchen Marital Status:   Intimate Partner Violence:   . Fear of Current or Ex-Partner:   .  Emotionally Abused:   Marland Kitchen Physically Abused:   . Sexually Abused:     Medications:   Current Outpatient Medications on File Prior to Visit  Medication Sig Dispense Refill  . atorvastatin (LIPITOR) 40 MG tablet Take 1 tablet (40 mg total) by mouth daily. 30 tablet 0  . carvedilol (COREG) 3.125 MG tablet Take 1 tablet (3.125 mg total) by mouth daily. 30 tablet 2  . Cyanocobalamin (VITAMIN B 12 PO) Take 1 tablet by mouth daily.    . diclofenac sodium (VOLTAREN) 1 % GEL Apply 4 g topically 3 (three) times daily as needed for pain.  2  . fluticasone (FLONASE) 50 MCG/ACT nasal spray Place 1 spray into both nostrils daily as needed for allergies or rhinitis.    . hydrochlorothiazide (HYDRODIURIL) 25 MG tablet Take 25 mg by mouth daily.    Marland Kitchen loratadine (CLARITIN) 10 MG tablet Take 10 mg by mouth daily as needed for allergies.    . Multiple Vitamins-Minerals (CENTRUM ADULTS PO) Take 1 tablet by mouth daily.    . Vitamin D, Ergocalciferol, 2000 units CAPS Take 1 tablet by mouth daily.    Marland Kitchen warfarin (JANTOVEN) 1 MG tablet Take 2.5 mg on 01/29/2020 Then 3.5 mg starting 01/30/2020 Check INR 01/31/2020 60 tablet 1   No current facility-administered medications on file prior to visit.    Allergies:  No Known Allergies  Physical Exam General: Frail elderly malnourished looking African-American lady, seated, in no evident distress Head: head normocephalic and atraumatic.  Neck: supple with no carotid or supraclavicular bruits Cardiovascular: regular rate and rhythm, no murmurs Musculoskeletal: Severe kyphoscoliosis. Left shoulder elevation limited due to rotator cuff pain. Skin:  no rash but scattered bilateral forearm petichiae Vascular:  Normal pulses all extremities Vitals:   03/18/20 1514  BP: 140/86  Pulse: 62   Neurologic Exam Mental Status: Awake and fully alert. Oriented to place and time. Recent and remote memory poor attention span, concentration and fund of knowledge diminished. Follows  simple midline and one-step commands. Mood and affect appropriate.  Cranial Nerves: Fundoscopic exam reveals sharp disc margins. Pupils equal, briskly reactive to light. Extraocular movements full without nystagmus. Visual fields full to confrontation. Hearing i diminished bilaterally facial sensation intact. Mild right lower facial weakness. Tongue, palate moves normally and symmetrically.  Motor: Generalized wasting. Left shoulder elevation limited due to pain. Weakness of left grip and intrinsic hand muscles. Left upper extremity drift. Mild bilateral lower extremity weakness and drift but symmetric.Marland Kitchen Sensory.: intact to touch ,pinprick .position and vibratory sensation.  Coordination: Rapid alternating movements normal in all extremities. Finger-to-nose and heel-to-shin performed accurately bilaterally. Gait and Station: Deferred as patient did not bring her walker and is using a wheelchair. Reflexes: 1+ and symmetric. Toes downgoing.   NIHSS  3 Modified Rankin  3   ASSESSMENT: 86 year old African-American lady with left subcortical infarct in May 2021 likely from atrial fibrillation with suboptimal anticoagulation. Vascular risk factors of atrial fibrillation, hypertension,  hyperlipidemia atrial fibrillation and age.     PLAN: I had a long d/w patient and her daughter about her recent stroke, risk for recurrent stroke/TIAs, personally independently reviewed imaging studies and stroke evaluation results and answered questions.Continue warfarin daily  for secondary stroke prevention with target INR between 2 and 3 and maintain strict control of hypertension with blood pressure goal below 130/90, diabetes with hemoglobin A1c goal below 6.5% and lipids with LDL cholesterol goal below 70 mg/dL. I also advised the patient to eat a healthy diet with plenty of whole grains, cereals, fruits and vegetables, exercise regularly and maintain ideal body weight.  I recommend using a walker at all times for  ambulation and we discussed fall and safety precautions.  No routines scheduled follow-up appointment with me is necessary but she may return as needed. Greater than 50% of time during this 30 minute visit was spent on counseling,explanation of diagnosis lacunar stroke,, atrial fibrillation planning of further management, discussion with patient and family and coordination of care Antony Contras, MD  [facility] [address] [place], [place] [address]  Phone [phone] Fax [phone] Note: This document was prepared with digital dictation and possible smart phrase technology. Any transcriptional errors that result from this process are unintentional

## 2020-03-18 NOTE — Patient Instructions (Addendum)
I had a long d/w patient and her daughter about her recent stroke, risk for recurrent stroke/TIAs, personally independently reviewed imaging studies and stroke evaluation results and answered questions.Continue warfarin daily  for secondary stroke prevention with target INR between 2 and 3 and maintain strict control of hypertension with blood pressure goal below 130/90, diabetes with hemoglobin A1c goal below 6.5% and lipids with LDL cholesterol goal below 70 mg/dL. I also advised the patient to eat a healthy diet with plenty of whole grains, cereals, fruits and vegetables, exercise regularly and maintain ideal body weight.  I recommend using a walker at all times for ambulation and we discussed fall and safety precautions.  No routines scheduled follow-up appointment with me is necessary but she may return as needed.  Fall Prevention in the Home, Adult Falls can cause injuries. They can happen to people of all ages. There are many things you can do to make your home safe and to help prevent falls. Ask for help when making these changes, if needed. What actions can I take to prevent falls? General Instructions  Use good lighting in all rooms. Replace any light bulbs that burn out.  Turn on the lights when you go into a dark area. Use night-lights.  Keep items that you use often in easy-to-reach places. Lower the shelves around your home if necessary.  Set up your furniture so you have a clear path. Avoid moving your furniture around.  Do not have throw rugs and other things on the floor that can make you trip.  Avoid walking on wet floors.  If any of your floors are uneven, fix them.  Add color or contrast paint or tape to clearly mark and help you see: ? Any grab bars or handrails. ? First and last steps of stairways. ? Where the edge of each step is.  If you use a stepladder: ? Make sure that it is fully opened. Do not climb a closed stepladder. ? Make sure that both sides of the  stepladder are locked into place. ? Ask someone to hold the stepladder for you while you use it.  If there are any pets around you, be aware of where they are. What can I do in the bathroom?      Keep the floor dry. Clean up any water that spills onto the floor as soon as it happens.  Remove soap buildup in the tub or shower regularly.  Use non-skid mats or decals on the floor of the tub or shower.  Attach bath mats securely with double-sided, non-slip rug tape.  If you need to sit down in the shower, use a plastic, non-slip stool.  Install grab bars by the toilet and in the tub and shower. Do not use towel bars as grab bars. What can I do in the bedroom?  Make sure that you have a light by your bed that is easy to reach.  Do not use any sheets or blankets that are too big for your bed. They should not hang down onto the floor.  Have a firm chair that has side arms. You can use this for support while you get dressed. What can I do in the kitchen?  Clean up any spills right away.  If you need to reach something above you, use a strong step stool that has a grab bar.  Keep electrical cords out of the way.  Do not use floor polish or wax that makes floors slippery. If you must use  wax, use non-skid floor wax. What can I do with my stairs?  Do not leave any items on the stairs.  Make sure that you have a light switch at the top of the stairs and the bottom of the stairs. If you do not have them, ask someone to add them for you.  Make sure that there are handrails on both sides of the stairs, and use them. Fix handrails that are broken or loose. Make sure that handrails are as long as the stairways.  Install non-slip stair treads on all stairs in your home.  Avoid having throw rugs at the top or bottom of the stairs. If you do have throw rugs, attach them to the floor with carpet tape.  Choose a carpet that does not hide the edge of the steps on the stairway.  Check any  carpeting to make sure that it is firmly attached to the stairs. Fix any carpet that is loose or worn. What can I do on the outside of my home?  Use bright outdoor lighting.  Regularly fix the edges of walkways and driveways and fix any cracks.  Remove anything that might make you trip as you walk through a door, such as a raised step or threshold.  Trim any bushes or trees on the path to your home.  Regularly check to see if handrails are loose or broken. Make sure that both sides of any steps have handrails.  Install guardrails along the edges of any raised decks and porches.  Clear walking paths of anything that might make someone trip, such as tools or rocks.  Have any leaves, snow, or ice cleared regularly.  Use sand or salt on walking paths during winter.  Clean up any spills in your garage right away. This includes grease or oil spills. What other actions can I take?  Wear shoes that: ? Have a low heel. Do not wear high heels. ? Have rubber bottoms. ? Are comfortable and fit you well. ? Are closed at the toe. Do not wear open-toe sandals.  Use tools that help you move around (mobility aids) if they are needed. These include: ? Canes. ? Walkers. ? Scooters. ? Crutches.  Review your medicines with your doctor. Some medicines can make you feel dizzy. This can increase your chance of falling. Ask your doctor what other things you can do to help prevent falls. Where to find more information  Centers for Disease Control and Prevention, STEADI: https://garcia.biz/  [employer] on Aging: BrainJudge.co.uk Contact a doctor if:  You are afraid of falling at home.  You feel weak, drowsy, or dizzy at home.  You fall at home. Summary  There are many simple things that you can do to make your home safe and to help prevent falls.  Ways to make your home safe include removing tripping hazards and installing grab bars in the bathroom.  Ask for help when  making these changes in your home. This information is not intended to replace advice given to you by your health care provider. Make sure you discuss any questions you have with your health care provider. Document Revised: 12/14/2018 Document Reviewed: 04/07/2017 Elsevier Patient Education  [employer]. and her daughterabout his recent stroke, risk for recurrent stroke/TIAs, personally independently reviewed imaging studies and stroke evaluation results and answered questions.Continue warfarin daily  for secondary stroke prevention with target INR between 2 and 3 and maintain strict control of hypertension with blood pressure goal below 130/90, diabetes with  hemoglobin A1c goal below 6.5% and lipids with LDL cholesterol goal below 70 mg/dL. I also advised the patient to eat a healthy diet with plenty of whole grains, cereals, fruits and vegetables, exercise regularly and maintain ideal body weight.  Encouraged patient to walk with a walker and we discussed fall safety precautions.  No routine scheduled follow-up appointment is necessary.

## 2020-03-20 DIAGNOSIS — G459 Transient cerebral ischemic attack, unspecified: Secondary | ICD-10-CM | POA: Diagnosis not present

## 2020-03-20 DIAGNOSIS — Z86711 Personal history of pulmonary embolism: Secondary | ICD-10-CM | POA: Diagnosis not present

## 2020-03-20 DIAGNOSIS — Z7901 Long term (current) use of anticoagulants: Secondary | ICD-10-CM | POA: Diagnosis not present

## 2020-04-01 DIAGNOSIS — L603 Nail dystrophy: Secondary | ICD-10-CM | POA: Diagnosis not present

## 2020-04-01 DIAGNOSIS — I739 Peripheral vascular disease, unspecified: Secondary | ICD-10-CM | POA: Diagnosis not present

## 2020-04-01 DIAGNOSIS — L84 Corns and callosities: Secondary | ICD-10-CM | POA: Diagnosis not present

## 2020-04-03 DIAGNOSIS — Z86711 Personal history of pulmonary embolism: Secondary | ICD-10-CM | POA: Diagnosis not present

## 2020-04-03 DIAGNOSIS — G459 Transient cerebral ischemic attack, unspecified: Secondary | ICD-10-CM | POA: Diagnosis not present

## 2020-04-03 DIAGNOSIS — Z7901 Long term (current) use of anticoagulants: Secondary | ICD-10-CM | POA: Diagnosis not present

## 2020-04-07 DIAGNOSIS — I129 Hypertensive chronic kidney disease with stage 1 through stage 4 chronic kidney disease, or unspecified chronic kidney disease: Secondary | ICD-10-CM | POA: Diagnosis not present

## 2020-04-07 DIAGNOSIS — Z86711 Personal history of pulmonary embolism: Secondary | ICD-10-CM | POA: Diagnosis not present

## 2020-04-07 DIAGNOSIS — N184 Chronic kidney disease, stage 4 (severe): Secondary | ICD-10-CM | POA: Diagnosis not present

## 2020-04-07 DIAGNOSIS — E785 Hyperlipidemia, unspecified: Secondary | ICD-10-CM | POA: Diagnosis not present

## 2020-04-14 DIAGNOSIS — N184 Chronic kidney disease, stage 4 (severe): Secondary | ICD-10-CM | POA: Diagnosis not present

## 2020-04-14 DIAGNOSIS — I129 Hypertensive chronic kidney disease with stage 1 through stage 4 chronic kidney disease, or unspecified chronic kidney disease: Secondary | ICD-10-CM | POA: Diagnosis not present

## 2020-04-14 DIAGNOSIS — Z7901 Long term (current) use of anticoagulants: Secondary | ICD-10-CM | POA: Diagnosis not present

## 2020-04-14 DIAGNOSIS — E785 Hyperlipidemia, unspecified: Secondary | ICD-10-CM | POA: Diagnosis not present

## 2020-04-14 DIAGNOSIS — G459 Transient cerebral ischemic attack, unspecified: Secondary | ICD-10-CM | POA: Diagnosis not present

## 2020-04-14 DIAGNOSIS — R413 Other amnesia: Secondary | ICD-10-CM | POA: Diagnosis not present

## 2020-04-14 DIAGNOSIS — Z86711 Personal history of pulmonary embolism: Secondary | ICD-10-CM | POA: Diagnosis not present

## 2020-05-15 DIAGNOSIS — G459 Transient cerebral ischemic attack, unspecified: Secondary | ICD-10-CM | POA: Diagnosis not present

## 2020-05-15 DIAGNOSIS — Z86711 Personal history of pulmonary embolism: Secondary | ICD-10-CM | POA: Diagnosis not present

## 2020-05-15 DIAGNOSIS — Z7901 Long term (current) use of anticoagulants: Secondary | ICD-10-CM | POA: Diagnosis not present

## 2020-05-23 DIAGNOSIS — Z23 Encounter for immunization: Secondary | ICD-10-CM | POA: Diagnosis not present

## 2020-06-12 DIAGNOSIS — Z7901 Long term (current) use of anticoagulants: Secondary | ICD-10-CM | POA: Diagnosis not present

## 2020-06-12 DIAGNOSIS — R600 Localized edema: Secondary | ICD-10-CM | POA: Diagnosis not present

## 2020-06-12 DIAGNOSIS — Z86711 Personal history of pulmonary embolism: Secondary | ICD-10-CM | POA: Diagnosis not present

## 2020-06-12 DIAGNOSIS — G459 Transient cerebral ischemic attack, unspecified: Secondary | ICD-10-CM | POA: Diagnosis not present

## 2020-06-21 DIAGNOSIS — Z23 Encounter for immunization: Secondary | ICD-10-CM | POA: Diagnosis not present

## 2020-07-03 DIAGNOSIS — I739 Peripheral vascular disease, unspecified: Secondary | ICD-10-CM | POA: Diagnosis not present

## 2020-07-03 DIAGNOSIS — L603 Nail dystrophy: Secondary | ICD-10-CM | POA: Diagnosis not present

## 2020-07-03 DIAGNOSIS — L84 Corns and callosities: Secondary | ICD-10-CM | POA: Diagnosis not present

## 2020-07-10 DIAGNOSIS — Z86711 Personal history of pulmonary embolism: Secondary | ICD-10-CM | POA: Diagnosis not present

## 2020-07-10 DIAGNOSIS — G459 Transient cerebral ischemic attack, unspecified: Secondary | ICD-10-CM | POA: Diagnosis not present

## 2020-07-10 DIAGNOSIS — Z7901 Long term (current) use of anticoagulants: Secondary | ICD-10-CM | POA: Diagnosis not present

## 2020-08-07 DIAGNOSIS — Z7901 Long term (current) use of anticoagulants: Secondary | ICD-10-CM | POA: Diagnosis not present

## 2020-08-07 DIAGNOSIS — Z86711 Personal history of pulmonary embolism: Secondary | ICD-10-CM | POA: Diagnosis not present

## 2020-08-07 DIAGNOSIS — G459 Transient cerebral ischemic attack, unspecified: Secondary | ICD-10-CM | POA: Diagnosis not present

## 2020-08-07 DIAGNOSIS — I129 Hypertensive chronic kidney disease with stage 1 through stage 4 chronic kidney disease, or unspecified chronic kidney disease: Secondary | ICD-10-CM | POA: Diagnosis not present

## 2020-08-27 DIAGNOSIS — G459 Transient cerebral ischemic attack, unspecified: Secondary | ICD-10-CM | POA: Diagnosis not present

## 2020-08-27 DIAGNOSIS — Z86711 Personal history of pulmonary embolism: Secondary | ICD-10-CM | POA: Diagnosis not present

## 2020-08-27 DIAGNOSIS — I129 Hypertensive chronic kidney disease with stage 1 through stage 4 chronic kidney disease, or unspecified chronic kidney disease: Secondary | ICD-10-CM | POA: Diagnosis not present

## 2020-08-27 DIAGNOSIS — Z7901 Long term (current) use of anticoagulants: Secondary | ICD-10-CM | POA: Diagnosis not present

## 2020-10-09 DIAGNOSIS — G459 Transient cerebral ischemic attack, unspecified: Secondary | ICD-10-CM | POA: Diagnosis not present

## 2020-10-09 DIAGNOSIS — Z86711 Personal history of pulmonary embolism: Secondary | ICD-10-CM | POA: Diagnosis not present

## 2020-10-09 DIAGNOSIS — Z7901 Long term (current) use of anticoagulants: Secondary | ICD-10-CM | POA: Diagnosis not present

## 2020-10-17 DIAGNOSIS — Z86711 Personal history of pulmonary embolism: Secondary | ICD-10-CM | POA: Diagnosis not present

## 2020-10-17 DIAGNOSIS — I69351 Hemiplegia and hemiparesis following cerebral infarction affecting right dominant side: Secondary | ICD-10-CM | POA: Diagnosis not present

## 2020-10-17 DIAGNOSIS — M6281 Muscle weakness (generalized): Secondary | ICD-10-CM | POA: Diagnosis not present

## 2020-10-17 DIAGNOSIS — M1380 Other specified arthritis, unspecified site: Secondary | ICD-10-CM | POA: Diagnosis not present

## 2020-10-17 DIAGNOSIS — R159 Full incontinence of feces: Secondary | ICD-10-CM | POA: Diagnosis not present

## 2020-10-17 DIAGNOSIS — I10 Essential (primary) hypertension: Secondary | ICD-10-CM | POA: Diagnosis not present

## 2020-10-17 DIAGNOSIS — R32 Unspecified urinary incontinence: Secondary | ICD-10-CM | POA: Diagnosis not present

## 2020-10-17 DIAGNOSIS — Z515 Encounter for palliative care: Secondary | ICD-10-CM | POA: Diagnosis not present

## 2020-10-20 DIAGNOSIS — I69351 Hemiplegia and hemiparesis following cerebral infarction affecting right dominant side: Secondary | ICD-10-CM | POA: Diagnosis not present

## 2020-10-20 DIAGNOSIS — R32 Unspecified urinary incontinence: Secondary | ICD-10-CM | POA: Diagnosis not present

## 2020-10-20 DIAGNOSIS — I10 Essential (primary) hypertension: Secondary | ICD-10-CM | POA: Diagnosis not present

## 2020-10-20 DIAGNOSIS — M6281 Muscle weakness (generalized): Secondary | ICD-10-CM | POA: Diagnosis not present

## 2020-10-20 DIAGNOSIS — M1380 Other specified arthritis, unspecified site: Secondary | ICD-10-CM | POA: Diagnosis not present

## 2020-10-20 DIAGNOSIS — R159 Full incontinence of feces: Secondary | ICD-10-CM | POA: Diagnosis not present

## 2020-10-21 DIAGNOSIS — R159 Full incontinence of feces: Secondary | ICD-10-CM | POA: Diagnosis not present

## 2020-10-21 DIAGNOSIS — I69351 Hemiplegia and hemiparesis following cerebral infarction affecting right dominant side: Secondary | ICD-10-CM | POA: Diagnosis not present

## 2020-10-21 DIAGNOSIS — M1380 Other specified arthritis, unspecified site: Secondary | ICD-10-CM | POA: Diagnosis not present

## 2020-10-21 DIAGNOSIS — I10 Essential (primary) hypertension: Secondary | ICD-10-CM | POA: Diagnosis not present

## 2020-10-21 DIAGNOSIS — R32 Unspecified urinary incontinence: Secondary | ICD-10-CM | POA: Diagnosis not present

## 2020-10-21 DIAGNOSIS — M6281 Muscle weakness (generalized): Secondary | ICD-10-CM | POA: Diagnosis not present

## 2020-10-22 DIAGNOSIS — I10 Essential (primary) hypertension: Secondary | ICD-10-CM | POA: Diagnosis not present

## 2020-10-22 DIAGNOSIS — M1380 Other specified arthritis, unspecified site: Secondary | ICD-10-CM | POA: Diagnosis not present

## 2020-10-22 DIAGNOSIS — M6281 Muscle weakness (generalized): Secondary | ICD-10-CM | POA: Diagnosis not present

## 2020-10-22 DIAGNOSIS — I69351 Hemiplegia and hemiparesis following cerebral infarction affecting right dominant side: Secondary | ICD-10-CM | POA: Diagnosis not present

## 2020-10-22 DIAGNOSIS — R159 Full incontinence of feces: Secondary | ICD-10-CM | POA: Diagnosis not present

## 2020-10-22 DIAGNOSIS — R32 Unspecified urinary incontinence: Secondary | ICD-10-CM | POA: Diagnosis not present

## 2020-10-23 DIAGNOSIS — I63412 Cerebral infarction due to embolism of left middle cerebral artery: Secondary | ICD-10-CM | POA: Diagnosis not present

## 2020-10-23 DIAGNOSIS — R32 Unspecified urinary incontinence: Secondary | ICD-10-CM | POA: Diagnosis not present

## 2020-10-23 DIAGNOSIS — M1380 Other specified arthritis, unspecified site: Secondary | ICD-10-CM | POA: Diagnosis not present

## 2020-10-23 DIAGNOSIS — I10 Essential (primary) hypertension: Secondary | ICD-10-CM | POA: Diagnosis not present

## 2020-10-23 DIAGNOSIS — I69351 Hemiplegia and hemiparesis following cerebral infarction affecting right dominant side: Secondary | ICD-10-CM | POA: Diagnosis not present

## 2020-10-23 DIAGNOSIS — R159 Full incontinence of feces: Secondary | ICD-10-CM | POA: Diagnosis not present

## 2020-10-23 DIAGNOSIS — M6281 Muscle weakness (generalized): Secondary | ICD-10-CM | POA: Diagnosis not present

## 2020-10-24 DIAGNOSIS — R159 Full incontinence of feces: Secondary | ICD-10-CM | POA: Diagnosis not present

## 2020-10-24 DIAGNOSIS — R32 Unspecified urinary incontinence: Secondary | ICD-10-CM | POA: Diagnosis not present

## 2020-10-24 DIAGNOSIS — M1380 Other specified arthritis, unspecified site: Secondary | ICD-10-CM | POA: Diagnosis not present

## 2020-10-24 DIAGNOSIS — I69351 Hemiplegia and hemiparesis following cerebral infarction affecting right dominant side: Secondary | ICD-10-CM | POA: Diagnosis not present

## 2020-10-24 DIAGNOSIS — M6281 Muscle weakness (generalized): Secondary | ICD-10-CM | POA: Diagnosis not present

## 2020-10-24 DIAGNOSIS — I10 Essential (primary) hypertension: Secondary | ICD-10-CM | POA: Diagnosis not present

## 2020-10-27 DIAGNOSIS — I10 Essential (primary) hypertension: Secondary | ICD-10-CM | POA: Diagnosis not present

## 2020-10-27 DIAGNOSIS — M6281 Muscle weakness (generalized): Secondary | ICD-10-CM | POA: Diagnosis not present

## 2020-10-27 DIAGNOSIS — R159 Full incontinence of feces: Secondary | ICD-10-CM | POA: Diagnosis not present

## 2020-10-27 DIAGNOSIS — R32 Unspecified urinary incontinence: Secondary | ICD-10-CM | POA: Diagnosis not present

## 2020-10-27 DIAGNOSIS — M1380 Other specified arthritis, unspecified site: Secondary | ICD-10-CM | POA: Diagnosis not present

## 2020-10-27 DIAGNOSIS — I69351 Hemiplegia and hemiparesis following cerebral infarction affecting right dominant side: Secondary | ICD-10-CM | POA: Diagnosis not present

## 2020-10-28 DIAGNOSIS — R159 Full incontinence of feces: Secondary | ICD-10-CM | POA: Diagnosis not present

## 2020-10-28 DIAGNOSIS — M1380 Other specified arthritis, unspecified site: Secondary | ICD-10-CM | POA: Diagnosis not present

## 2020-10-28 DIAGNOSIS — I10 Essential (primary) hypertension: Secondary | ICD-10-CM | POA: Diagnosis not present

## 2020-10-28 DIAGNOSIS — I69351 Hemiplegia and hemiparesis following cerebral infarction affecting right dominant side: Secondary | ICD-10-CM | POA: Diagnosis not present

## 2020-10-28 DIAGNOSIS — M6281 Muscle weakness (generalized): Secondary | ICD-10-CM | POA: Diagnosis not present

## 2020-10-28 DIAGNOSIS — R32 Unspecified urinary incontinence: Secondary | ICD-10-CM | POA: Diagnosis not present

## 2020-10-31 DIAGNOSIS — I10 Essential (primary) hypertension: Secondary | ICD-10-CM | POA: Diagnosis not present

## 2020-10-31 DIAGNOSIS — I69351 Hemiplegia and hemiparesis following cerebral infarction affecting right dominant side: Secondary | ICD-10-CM | POA: Diagnosis not present

## 2020-10-31 DIAGNOSIS — M1380 Other specified arthritis, unspecified site: Secondary | ICD-10-CM | POA: Diagnosis not present

## 2020-10-31 DIAGNOSIS — M6281 Muscle weakness (generalized): Secondary | ICD-10-CM | POA: Diagnosis not present

## 2020-10-31 DIAGNOSIS — R32 Unspecified urinary incontinence: Secondary | ICD-10-CM | POA: Diagnosis not present

## 2020-10-31 DIAGNOSIS — R159 Full incontinence of feces: Secondary | ICD-10-CM | POA: Diagnosis not present

## 2020-11-03 DIAGNOSIS — R32 Unspecified urinary incontinence: Secondary | ICD-10-CM | POA: Diagnosis not present

## 2020-11-03 DIAGNOSIS — R159 Full incontinence of feces: Secondary | ICD-10-CM | POA: Diagnosis not present

## 2020-11-03 DIAGNOSIS — M1380 Other specified arthritis, unspecified site: Secondary | ICD-10-CM | POA: Diagnosis not present

## 2020-11-03 DIAGNOSIS — I10 Essential (primary) hypertension: Secondary | ICD-10-CM | POA: Diagnosis not present

## 2020-11-03 DIAGNOSIS — M6281 Muscle weakness (generalized): Secondary | ICD-10-CM | POA: Diagnosis not present

## 2020-11-03 DIAGNOSIS — I69351 Hemiplegia and hemiparesis following cerebral infarction affecting right dominant side: Secondary | ICD-10-CM | POA: Diagnosis not present

## 2020-11-04 DIAGNOSIS — M1380 Other specified arthritis, unspecified site: Secondary | ICD-10-CM | POA: Diagnosis not present

## 2020-11-04 DIAGNOSIS — Z86711 Personal history of pulmonary embolism: Secondary | ICD-10-CM | POA: Diagnosis not present

## 2020-11-04 DIAGNOSIS — Z515 Encounter for palliative care: Secondary | ICD-10-CM | POA: Diagnosis not present

## 2020-11-04 DIAGNOSIS — I10 Essential (primary) hypertension: Secondary | ICD-10-CM | POA: Diagnosis not present

## 2020-11-04 DIAGNOSIS — R32 Unspecified urinary incontinence: Secondary | ICD-10-CM | POA: Diagnosis not present

## 2020-11-04 DIAGNOSIS — I69351 Hemiplegia and hemiparesis following cerebral infarction affecting right dominant side: Secondary | ICD-10-CM | POA: Diagnosis not present

## 2020-11-04 DIAGNOSIS — R159 Full incontinence of feces: Secondary | ICD-10-CM | POA: Diagnosis not present

## 2020-11-04 DIAGNOSIS — M6281 Muscle weakness (generalized): Secondary | ICD-10-CM | POA: Diagnosis not present

## 2020-11-05 DIAGNOSIS — R32 Unspecified urinary incontinence: Secondary | ICD-10-CM | POA: Diagnosis not present

## 2020-11-05 DIAGNOSIS — I10 Essential (primary) hypertension: Secondary | ICD-10-CM | POA: Diagnosis not present

## 2020-11-05 DIAGNOSIS — M1380 Other specified arthritis, unspecified site: Secondary | ICD-10-CM | POA: Diagnosis not present

## 2020-11-05 DIAGNOSIS — I69351 Hemiplegia and hemiparesis following cerebral infarction affecting right dominant side: Secondary | ICD-10-CM | POA: Diagnosis not present

## 2020-11-05 DIAGNOSIS — M6281 Muscle weakness (generalized): Secondary | ICD-10-CM | POA: Diagnosis not present

## 2020-11-05 DIAGNOSIS — R159 Full incontinence of feces: Secondary | ICD-10-CM | POA: Diagnosis not present

## 2020-11-07 DIAGNOSIS — M6281 Muscle weakness (generalized): Secondary | ICD-10-CM | POA: Diagnosis not present

## 2020-11-07 DIAGNOSIS — I10 Essential (primary) hypertension: Secondary | ICD-10-CM | POA: Diagnosis not present

## 2020-11-07 DIAGNOSIS — R32 Unspecified urinary incontinence: Secondary | ICD-10-CM | POA: Diagnosis not present

## 2020-11-07 DIAGNOSIS — I69351 Hemiplegia and hemiparesis following cerebral infarction affecting right dominant side: Secondary | ICD-10-CM | POA: Diagnosis not present

## 2020-11-07 DIAGNOSIS — M1380 Other specified arthritis, unspecified site: Secondary | ICD-10-CM | POA: Diagnosis not present

## 2020-11-07 DIAGNOSIS — R159 Full incontinence of feces: Secondary | ICD-10-CM | POA: Diagnosis not present

## 2020-11-10 DIAGNOSIS — I69351 Hemiplegia and hemiparesis following cerebral infarction affecting right dominant side: Secondary | ICD-10-CM | POA: Diagnosis not present

## 2020-11-10 DIAGNOSIS — I10 Essential (primary) hypertension: Secondary | ICD-10-CM | POA: Diagnosis not present

## 2020-11-10 DIAGNOSIS — M6281 Muscle weakness (generalized): Secondary | ICD-10-CM | POA: Diagnosis not present

## 2020-11-10 DIAGNOSIS — R159 Full incontinence of feces: Secondary | ICD-10-CM | POA: Diagnosis not present

## 2020-11-10 DIAGNOSIS — M1380 Other specified arthritis, unspecified site: Secondary | ICD-10-CM | POA: Diagnosis not present

## 2020-11-10 DIAGNOSIS — R32 Unspecified urinary incontinence: Secondary | ICD-10-CM | POA: Diagnosis not present

## 2020-11-12 DIAGNOSIS — M6281 Muscle weakness (generalized): Secondary | ICD-10-CM | POA: Diagnosis not present

## 2020-11-12 DIAGNOSIS — I10 Essential (primary) hypertension: Secondary | ICD-10-CM | POA: Diagnosis not present

## 2020-11-12 DIAGNOSIS — M1380 Other specified arthritis, unspecified site: Secondary | ICD-10-CM | POA: Diagnosis not present

## 2020-11-12 DIAGNOSIS — R159 Full incontinence of feces: Secondary | ICD-10-CM | POA: Diagnosis not present

## 2020-11-12 DIAGNOSIS — R32 Unspecified urinary incontinence: Secondary | ICD-10-CM | POA: Diagnosis not present

## 2020-11-12 DIAGNOSIS — I69351 Hemiplegia and hemiparesis following cerebral infarction affecting right dominant side: Secondary | ICD-10-CM | POA: Diagnosis not present

## 2020-11-13 DIAGNOSIS — R32 Unspecified urinary incontinence: Secondary | ICD-10-CM | POA: Diagnosis not present

## 2020-11-13 DIAGNOSIS — R159 Full incontinence of feces: Secondary | ICD-10-CM | POA: Diagnosis not present

## 2020-11-13 DIAGNOSIS — I69351 Hemiplegia and hemiparesis following cerebral infarction affecting right dominant side: Secondary | ICD-10-CM | POA: Diagnosis not present

## 2020-11-13 DIAGNOSIS — M6281 Muscle weakness (generalized): Secondary | ICD-10-CM | POA: Diagnosis not present

## 2020-11-13 DIAGNOSIS — I10 Essential (primary) hypertension: Secondary | ICD-10-CM | POA: Diagnosis not present

## 2020-11-13 DIAGNOSIS — M1380 Other specified arthritis, unspecified site: Secondary | ICD-10-CM | POA: Diagnosis not present

## 2020-11-14 DIAGNOSIS — I10 Essential (primary) hypertension: Secondary | ICD-10-CM | POA: Diagnosis not present

## 2020-11-14 DIAGNOSIS — M1380 Other specified arthritis, unspecified site: Secondary | ICD-10-CM | POA: Diagnosis not present

## 2020-11-14 DIAGNOSIS — M6281 Muscle weakness (generalized): Secondary | ICD-10-CM | POA: Diagnosis not present

## 2020-11-14 DIAGNOSIS — R32 Unspecified urinary incontinence: Secondary | ICD-10-CM | POA: Diagnosis not present

## 2020-11-14 DIAGNOSIS — I69351 Hemiplegia and hemiparesis following cerebral infarction affecting right dominant side: Secondary | ICD-10-CM | POA: Diagnosis not present

## 2020-11-14 DIAGNOSIS — R159 Full incontinence of feces: Secondary | ICD-10-CM | POA: Diagnosis not present

## 2020-11-15 DIAGNOSIS — I69351 Hemiplegia and hemiparesis following cerebral infarction affecting right dominant side: Secondary | ICD-10-CM | POA: Diagnosis not present

## 2020-11-15 DIAGNOSIS — M6281 Muscle weakness (generalized): Secondary | ICD-10-CM | POA: Diagnosis not present

## 2020-11-15 DIAGNOSIS — M1380 Other specified arthritis, unspecified site: Secondary | ICD-10-CM | POA: Diagnosis not present

## 2020-11-15 DIAGNOSIS — R159 Full incontinence of feces: Secondary | ICD-10-CM | POA: Diagnosis not present

## 2020-11-15 DIAGNOSIS — R32 Unspecified urinary incontinence: Secondary | ICD-10-CM | POA: Diagnosis not present

## 2020-11-15 DIAGNOSIS — I10 Essential (primary) hypertension: Secondary | ICD-10-CM | POA: Diagnosis not present

## 2020-11-16 DIAGNOSIS — R159 Full incontinence of feces: Secondary | ICD-10-CM | POA: Diagnosis not present

## 2020-11-16 DIAGNOSIS — M1380 Other specified arthritis, unspecified site: Secondary | ICD-10-CM | POA: Diagnosis not present

## 2020-11-16 DIAGNOSIS — I10 Essential (primary) hypertension: Secondary | ICD-10-CM | POA: Diagnosis not present

## 2020-11-16 DIAGNOSIS — I69351 Hemiplegia and hemiparesis following cerebral infarction affecting right dominant side: Secondary | ICD-10-CM | POA: Diagnosis not present

## 2020-11-16 DIAGNOSIS — M6281 Muscle weakness (generalized): Secondary | ICD-10-CM | POA: Diagnosis not present

## 2020-11-16 DIAGNOSIS — R32 Unspecified urinary incontinence: Secondary | ICD-10-CM | POA: Diagnosis not present

## 2020-11-17 DIAGNOSIS — R32 Unspecified urinary incontinence: Secondary | ICD-10-CM | POA: Diagnosis not present

## 2020-11-17 DIAGNOSIS — I69351 Hemiplegia and hemiparesis following cerebral infarction affecting right dominant side: Secondary | ICD-10-CM | POA: Diagnosis not present

## 2020-11-17 DIAGNOSIS — R159 Full incontinence of feces: Secondary | ICD-10-CM | POA: Diagnosis not present

## 2020-11-17 DIAGNOSIS — M6281 Muscle weakness (generalized): Secondary | ICD-10-CM | POA: Diagnosis not present

## 2020-11-17 DIAGNOSIS — I10 Essential (primary) hypertension: Secondary | ICD-10-CM | POA: Diagnosis not present

## 2020-11-17 DIAGNOSIS — M1380 Other specified arthritis, unspecified site: Secondary | ICD-10-CM | POA: Diagnosis not present

## 2020-11-18 DIAGNOSIS — I10 Essential (primary) hypertension: Secondary | ICD-10-CM | POA: Diagnosis not present

## 2020-11-18 DIAGNOSIS — R32 Unspecified urinary incontinence: Secondary | ICD-10-CM | POA: Diagnosis not present

## 2020-11-18 DIAGNOSIS — M1380 Other specified arthritis, unspecified site: Secondary | ICD-10-CM | POA: Diagnosis not present

## 2020-11-18 DIAGNOSIS — M6281 Muscle weakness (generalized): Secondary | ICD-10-CM | POA: Diagnosis not present

## 2020-11-18 DIAGNOSIS — I69351 Hemiplegia and hemiparesis following cerebral infarction affecting right dominant side: Secondary | ICD-10-CM | POA: Diagnosis not present

## 2020-11-18 DIAGNOSIS — R159 Full incontinence of feces: Secondary | ICD-10-CM | POA: Diagnosis not present

## 2020-11-19 DIAGNOSIS — M6281 Muscle weakness (generalized): Secondary | ICD-10-CM | POA: Diagnosis not present

## 2020-11-19 DIAGNOSIS — I69351 Hemiplegia and hemiparesis following cerebral infarction affecting right dominant side: Secondary | ICD-10-CM | POA: Diagnosis not present

## 2020-11-19 DIAGNOSIS — R32 Unspecified urinary incontinence: Secondary | ICD-10-CM | POA: Diagnosis not present

## 2020-11-19 DIAGNOSIS — M1380 Other specified arthritis, unspecified site: Secondary | ICD-10-CM | POA: Diagnosis not present

## 2020-11-19 DIAGNOSIS — R159 Full incontinence of feces: Secondary | ICD-10-CM | POA: Diagnosis not present

## 2020-11-19 DIAGNOSIS — I10 Essential (primary) hypertension: Secondary | ICD-10-CM | POA: Diagnosis not present

## 2020-11-20 DIAGNOSIS — I10 Essential (primary) hypertension: Secondary | ICD-10-CM | POA: Diagnosis not present

## 2020-11-20 DIAGNOSIS — R32 Unspecified urinary incontinence: Secondary | ICD-10-CM | POA: Diagnosis not present

## 2020-11-20 DIAGNOSIS — I69351 Hemiplegia and hemiparesis following cerebral infarction affecting right dominant side: Secondary | ICD-10-CM | POA: Diagnosis not present

## 2020-11-20 DIAGNOSIS — M6281 Muscle weakness (generalized): Secondary | ICD-10-CM | POA: Diagnosis not present

## 2020-11-20 DIAGNOSIS — R159 Full incontinence of feces: Secondary | ICD-10-CM | POA: Diagnosis not present

## 2020-11-20 DIAGNOSIS — M1380 Other specified arthritis, unspecified site: Secondary | ICD-10-CM | POA: Diagnosis not present

## 2020-12-05 DEATH — deceased
